# Patient Record
Sex: Female | Born: 1975 | Race: White | Hispanic: No | Marital: Married | Smoking: Never smoker
Health system: Southern US, Community
[De-identification: ages and names within clinical notes are randomized; demographics above are authoritative.]

## PROBLEM LIST (undated history)

## (undated) DIAGNOSIS — E785 Hyperlipidemia, unspecified: Secondary | ICD-10-CM

## (undated) DIAGNOSIS — N200 Calculus of kidney: Secondary | ICD-10-CM

## (undated) DIAGNOSIS — N3090 Cystitis, unspecified without hematuria: Secondary | ICD-10-CM

## (undated) DIAGNOSIS — K219 Gastro-esophageal reflux disease without esophagitis: Secondary | ICD-10-CM

## (undated) DIAGNOSIS — C439 Malignant melanoma of skin, unspecified: Secondary | ICD-10-CM

## (undated) DIAGNOSIS — K5909 Other constipation: Secondary | ICD-10-CM

## (undated) DIAGNOSIS — F411 Generalized anxiety disorder: Secondary | ICD-10-CM

## (undated) DIAGNOSIS — Z8742 Personal history of other diseases of the female genital tract: Secondary | ICD-10-CM

## (undated) DIAGNOSIS — Z8619 Personal history of other infectious and parasitic diseases: Secondary | ICD-10-CM

## (undated) DIAGNOSIS — Z8679 Personal history of other diseases of the circulatory system: Secondary | ICD-10-CM

## (undated) DIAGNOSIS — R002 Palpitations: Secondary | ICD-10-CM

## (undated) HISTORY — DX: Malignant melanoma of skin, unspecified: C43.9

## (undated) HISTORY — DX: Personal history of other diseases of the female genital tract: Z87.42

## (undated) HISTORY — DX: Personal history of other diseases of the circulatory system: Z86.79

## (undated) HISTORY — DX: Cystitis, unspecified without hematuria: N30.90

## (undated) HISTORY — DX: Personal history of other infectious and parasitic diseases: Z86.19

## (undated) HISTORY — DX: Generalized anxiety disorder: F41.1

## (undated) HISTORY — DX: Other constipation: K59.09

## (undated) HISTORY — DX: Gastro-esophageal reflux disease without esophagitis: K21.9

## (undated) HISTORY — DX: Palpitations: R00.2

## (undated) HISTORY — DX: Hyperlipidemia, unspecified: E78.5

## (undated) HISTORY — PX: HYSTEROSCOPY: SHX211

---

## 2006-04-13 ENCOUNTER — Ambulatory Visit: Payer: Self-pay | Admitting: Internal Medicine

## 2006-04-21 ENCOUNTER — Ambulatory Visit: Payer: Self-pay

## 2008-08-31 DIAGNOSIS — C439 Malignant melanoma of skin, unspecified: Secondary | ICD-10-CM

## 2008-08-31 HISTORY — DX: Malignant melanoma of skin, unspecified: C43.9

## 2009-05-18 DIAGNOSIS — E785 Hyperlipidemia, unspecified: Secondary | ICD-10-CM

## 2009-05-18 DIAGNOSIS — Z8679 Personal history of other diseases of the circulatory system: Secondary | ICD-10-CM | POA: Insufficient documentation

## 2009-05-18 DIAGNOSIS — K219 Gastro-esophageal reflux disease without esophagitis: Secondary | ICD-10-CM

## 2009-05-18 DIAGNOSIS — F411 Generalized anxiety disorder: Secondary | ICD-10-CM

## 2009-05-18 HISTORY — DX: Hyperlipidemia, unspecified: E78.5

## 2009-05-18 HISTORY — DX: Generalized anxiety disorder: F41.1

## 2009-05-18 HISTORY — DX: Personal history of other diseases of the circulatory system: Z86.79

## 2009-05-18 HISTORY — DX: Gastro-esophageal reflux disease without esophagitis: K21.9

## 2014-03-19 ENCOUNTER — Other Ambulatory Visit (HOSPITAL_COMMUNITY): Payer: Self-pay | Admitting: Obstetrics and Gynecology

## 2014-03-19 DIAGNOSIS — Z3141 Encounter for fertility testing: Secondary | ICD-10-CM

## 2014-03-22 ENCOUNTER — Ambulatory Visit (HOSPITAL_COMMUNITY)
Admission: RE | Admit: 2014-03-22 | Discharge: 2014-03-22 | Disposition: A | Discharge status: Home / Self Care | Payer: BC Managed Care – PPO | Source: Ambulatory Visit | Attending: Obstetrics and Gynecology | Admitting: Obstetrics and Gynecology

## 2014-03-22 DIAGNOSIS — N979 Female infertility, unspecified: Secondary | ICD-10-CM | POA: Insufficient documentation

## 2014-03-22 DIAGNOSIS — Z3141 Encounter for fertility testing: Secondary | ICD-10-CM

## 2014-03-22 MED ORDER — IOHEXOL 300 MG/ML  SOLN
20.0000 mL | Freq: Once | INTRAMUSCULAR | Status: AC | PRN
  Administered 2014-03-22: 20 mL

## 2014-08-31 HISTORY — PX: DIAGNOSTIC LAPAROSCOPY: SUR761

## 2015-03-15 ENCOUNTER — Encounter (HOSPITAL_BASED_OUTPATIENT_CLINIC_OR_DEPARTMENT_OTHER): Admission: RE | Payer: Self-pay | Source: Ambulatory Visit

## 2015-03-15 ENCOUNTER — Ambulatory Visit (HOSPITAL_BASED_OUTPATIENT_CLINIC_OR_DEPARTMENT_OTHER)
Admission: RE | Admit: 2015-03-15 | Payer: BLUE CROSS/BLUE SHIELD | Source: Ambulatory Visit | Admitting: Obstetrics and Gynecology

## 2015-03-15 SURGERY — HYSTEROSCOPY
Anesthesia: General

## 2015-04-03 ENCOUNTER — Encounter: Payer: Self-pay | Admitting: Gastroenterology

## 2015-05-02 DIAGNOSIS — N200 Calculus of kidney: Secondary | ICD-10-CM

## 2015-05-02 HISTORY — PX: DIAGNOSTIC LAPAROSCOPY: SUR761

## 2015-05-02 HISTORY — DX: Calculus of kidney: N20.0

## 2015-06-03 ENCOUNTER — Ambulatory Visit: Payer: BLUE CROSS/BLUE SHIELD | Admitting: Gastroenterology

## 2015-06-03 ENCOUNTER — Encounter: Payer: Self-pay | Admitting: *Deleted

## 2015-09-24 IMAGING — RF DG HYSTEROGRAM
5 series · 5 of 5 positions shown · non-contrast
Comparison: None.

CLINICAL DATA: Infertility.

EXAM:
HYSTEROSALPINGOGRAM
TECHNIQUE: Hysterosalpingogram was performed by the ordering physician under
fluoroscopy. Fluoroscopic images were submitted for radiologic
interpretation following the procedure. Please see the procedural
report for the amount of contrast and the fluoroscopy time utilized.

[Series 1: run · 1 of 1 slices shown (1 of 5)]
[im 1/1]
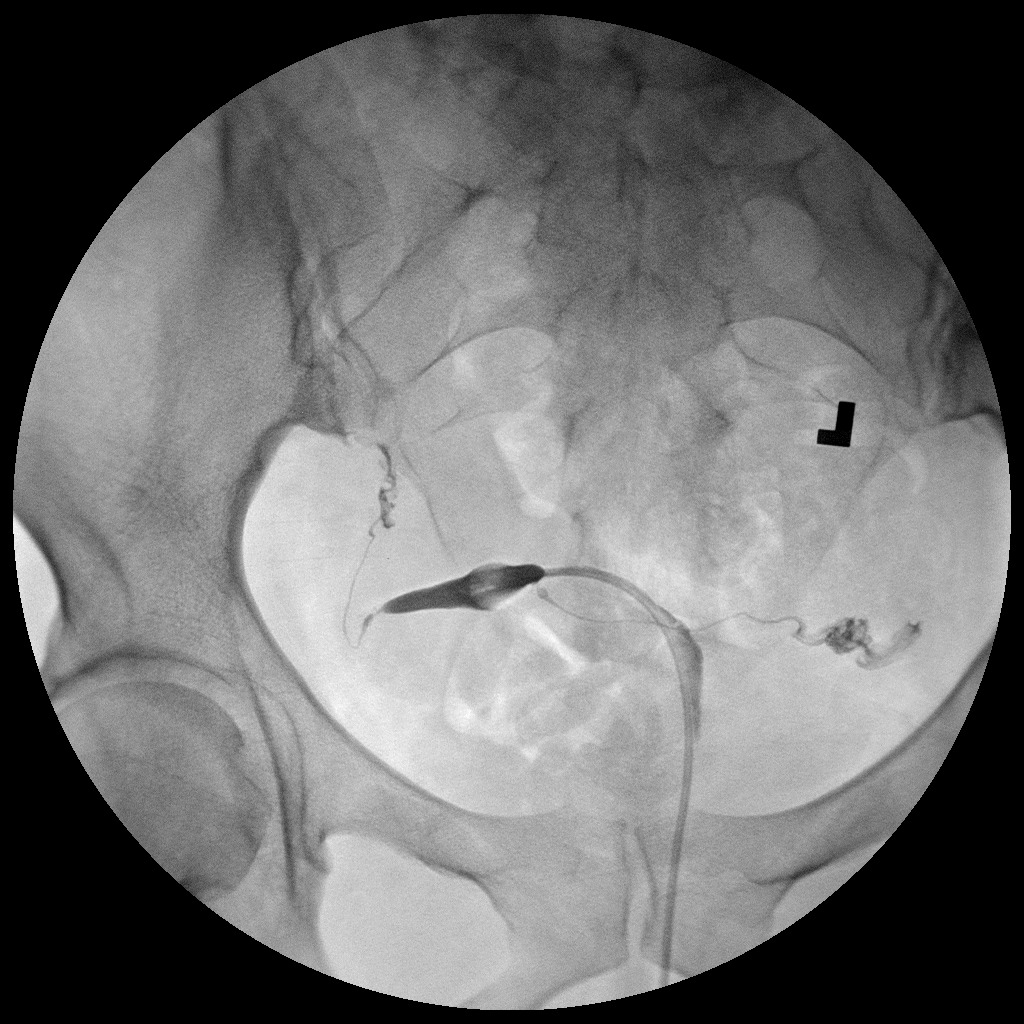

[Series 2: run · 1 of 1 slices shown (2 of 5)]
[im 1/1]
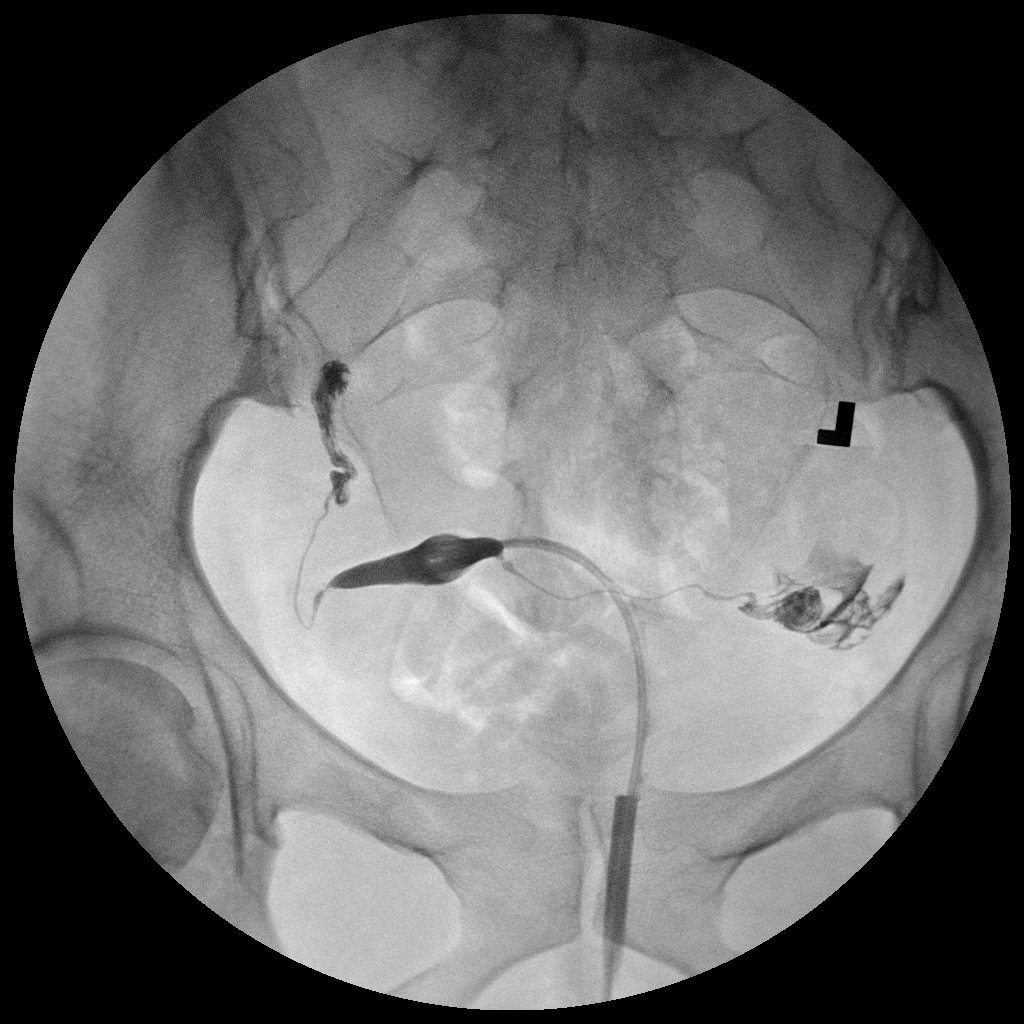

[Series 3: run · 1 of 1 slices shown (3 of 5)]
[im 1/1]
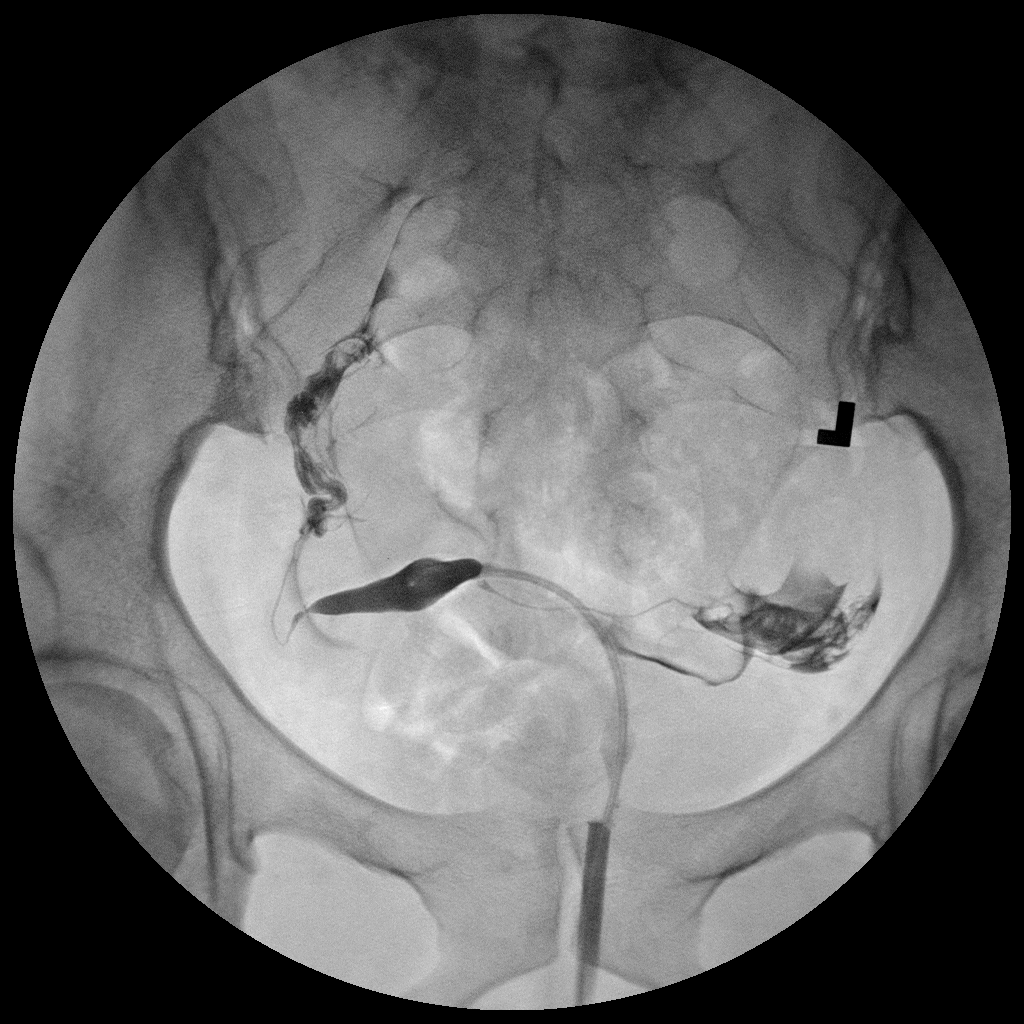

[Series 4: run · 1 of 1 slices shown (4 of 5)]
[im 1/1]
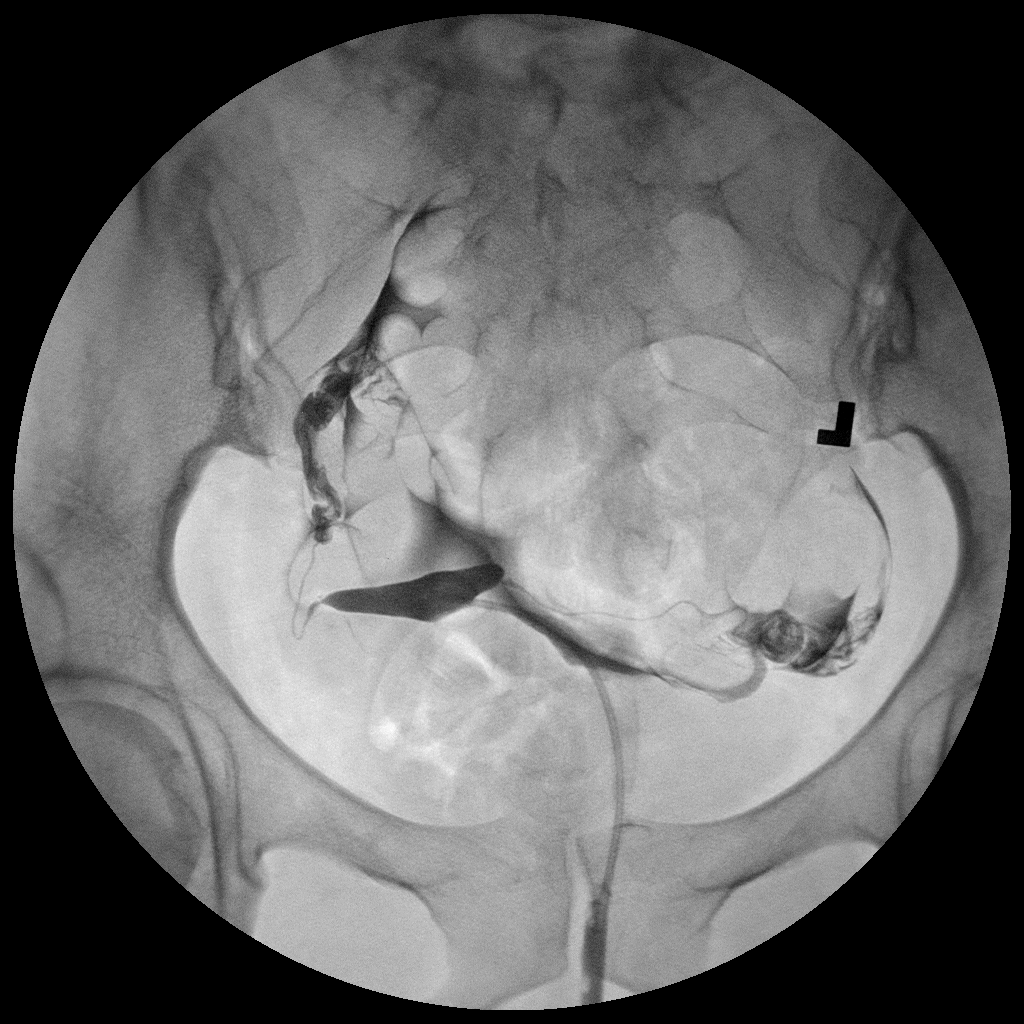

[Series 5: run · 1 of 1 slices shown (5 of 5)]
[im 1/1]
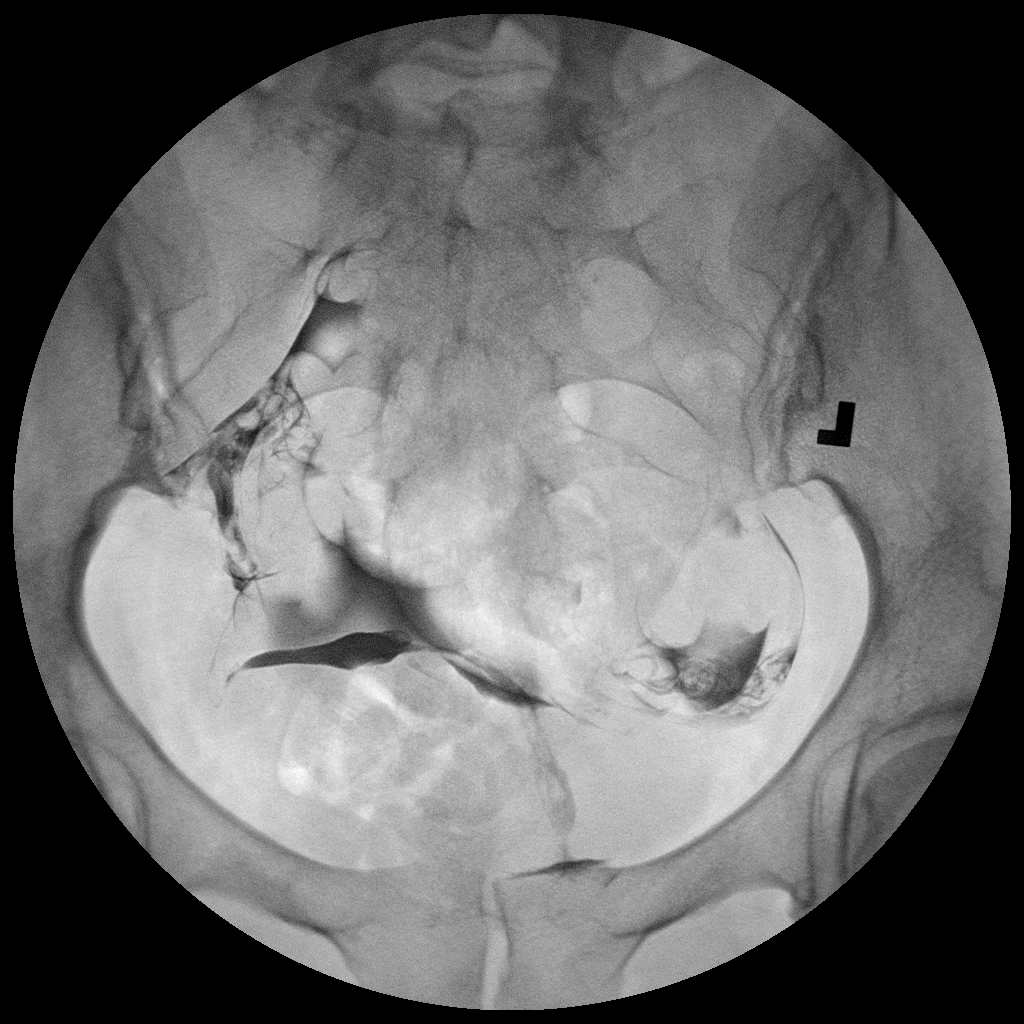

[5 of 5 positions shown; findings below may reference images not displayed]

FINDINGS: The endometrial cavity is normal in appearance and contour. No signs
of Mullerian duct anomaly.

Opacification of both fallopian tubes is seen. Both tubes appear
normal. Intraperitoneal spill of contrast from both fallopian tubes
is demonstrated.
IMPRESSION: Normal study. Both fallopian tubes are patent.

## 2017-02-11 DIAGNOSIS — R002 Palpitations: Secondary | ICD-10-CM | POA: Insufficient documentation

## 2017-02-11 HISTORY — DX: Palpitations: R00.2

## 2017-02-11 NOTE — Progress Notes (Deleted)
Cardiology Office Note    Date:  02/11/2017   ID:  Diamond Hardin, DOB [birth year], MRN [id number]  PCP:  No primary care provider on file.  Cardiologist: Sinclair Grooms, MD   No chief complaint on file.   History of Present Illness:  Diamond Hardin is a 41 y.o. female ***    Past Medical History:  Diagnosis Date  . ANXIETY 05/18/2009   Qualifier: Diagnosis of  By: Danny Lawless CMA, [place]    . GASTROESOPHAGEAL REFLUX DISEASE 05/18/2009   Qualifier: Diagnosis of  By: [place], [place]    . HYPERLIPIDEMIA 05/18/2009   Qualifier: Diagnosis of  By: Danny Lawless CMA, [place]    . Personal history of unspecified circulatory disease 05/18/2009   Centricity Description: PALPITATIONS, HX OF Qualifier: Diagnosis of  By: Danny Lawless CMA, [place]   Centricity Description: CHEST PAIN, HX OF Qualifier: Diagnosis of  By: [place] CMA, [place]      No past surgical history on file.  Current Medications: No outpatient prescriptions prior to visit.   No facility-administered medications prior to visit.      Allergies:   Patient has no allergy information on record.   Social History   Social History  . Marital status: Married    Spouse name: N/A  . Number of children: N/A  . Years of education: N/A   Social History Main Topics  . Smoking status: Not on file  . Smokeless tobacco: Not on file  . Alcohol use Not on file  . Drug use: Unknown  . Sexual activity: Not on file   Other Topics Concern  . Not on file   Social History Narrative  . No narrative on file     Family History:  The patient's ***family history is not on file.   ROS:   Please see the history of present illness.    ***  All other systems reviewed and are negative.   PHYSICAL EXAM:   VS:  There were no vitals taken for this visit.   GEN: Well nourished, well developed, in no acute distress HEENT: normal Neck: no JVD, carotid bruits, or masses Cardiac: ***RRR; no murmurs, rubs, or gallops,no edema  Respiratory:   clear to auscultation bilaterally, normal work of breathing GI: soft, nontender, nondistended, + BS MS: no deformity or atrophy Skin: warm and dry, no rash Neuro:  Alert and Oriented x 3, Strength and sensation are intact Psych: euthymic mood, full affect  Wt Readings from Last 3 Encounters:  No data found for Wt      Studies/Labs Reviewed:   EKG:  EKG  ***  Recent Labs: No results found for requested labs within last 8760 hours.   Lipid Panel No results found for: CHOL, TRIG, HDL, CHOLHDL, VLDL, LDLCALC, LDLDIRECT  Additional studies/ records that were reviewed today include:  ***    ASSESSMENT:    1. Hyperlipidemia LDL goal <100      PLAN:  In order of problems listed above:  1. ***    Medication Adjustments/Labs and Tests Ordered: Current medicines are reviewed at length with the patient today.  Concerns regarding medicines are outlined above.  Medication changes, Labs and Tests ordered today are listed in the Patient Instructions below. There are no Patient Instructions on file for this visit.   Signed, Sinclair Grooms, MD  02/11/2017 4:06 PM    [facility] [place], [place], [place]  [address] Phone: [phone]; Fax: [phone]

## 2017-02-12 ENCOUNTER — Ambulatory Visit: Payer: BLUE CROSS/BLUE SHIELD | Admitting: Interventional Cardiology

## 2017-03-16 ENCOUNTER — Telehealth: Payer: Self-pay | Admitting: *Deleted

## 2017-03-16 ENCOUNTER — Ambulatory Visit (INDEPENDENT_AMBULATORY_CARE_PROVIDER_SITE_OTHER): Payer: 59 | Admitting: Nurse Practitioner

## 2017-03-16 ENCOUNTER — Encounter: Payer: Self-pay | Admitting: Nurse Practitioner

## 2017-03-16 ENCOUNTER — Encounter (INDEPENDENT_AMBULATORY_CARE_PROVIDER_SITE_OTHER): Payer: Self-pay

## 2017-03-16 VITALS — BP 142/70 | HR 94 | Ht 67.0 in | Wt 193.4 lb

## 2017-03-16 DIAGNOSIS — Z3490 Encounter for supervision of normal pregnancy, unspecified, unspecified trimester: Secondary | ICD-10-CM | POA: Diagnosis not present

## 2017-03-16 DIAGNOSIS — R002 Palpitations: Secondary | ICD-10-CM | POA: Diagnosis not present

## 2017-03-16 NOTE — Telephone Encounter (Signed)
S/w [facility], [place].to Jinny Blossom will fax to office pt's last cmet/tsh. # is [phone].

## 2017-03-16 NOTE — Patient Instructions (Addendum)
We will be checking the following labs today - NONE  Please get her most recent TSH and BMET from her OBGYN   Medication Instructions:    Continue with your current medicines.     Testing/Procedures To Be Arranged:  Echocardiogram  Will place an event monitor here if needed but for now - will transmit EKG thru work  Follow-Up:   Will see how your studies turn out and then decide about follow up.      Other Special Instructions:   N/A    If you need a refill on your cardiac medications before your next appointment, please call your pharmacy.   Call the [place] office at [phone] if you have any questions, problems or concerns.

## 2017-03-16 NOTE — Progress Notes (Signed)
CARDIOLOGY OFFICE NOTE  Date:  03/16/2017    Diamond Hardin Date of Birth: September 18, 1975 edical Record #[id number]  PCP:  System, Pcp Not In  Cardiologist:  Lovena Le (NEW/DOD)   Chief Complaint  Patient presents with  . Palpitations    New patient visit - seen for Dr. Lovena Le (DOD)    History of Present Illness: Diamond Hardin is a 41 y.o. female who presents today for a new patient visit. Seen at the request of Dr. Helane Rima - OBGYN for palpitations/heart racing.   No known cardiac history.   Comes in today. Here with her husband. This is her first pregnancy. She is [redacted] weeks pregnant as of tomorrow. 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 Haroldine Laws. Negative evaluation at that time. Felt to be more stress related. 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 Lots of stress with the change to [place] - change in benefits, etc. She notes she has had a total of 4 recent episodes of tachycardia - HR up in the 120's to 130's. Makes her feel anxious. Will last 30 to 45 minutes. Typically happens at work. She did document one spell on a rhythm strip - says it was all sinus tach. I do not have this in the records.  She has no other symptoms in association with this. Does not use caffeine. No chocolate. No alcohol. She admits she has gotten "lazy" and does not exercise. Little swelling in her feet - does use salt and it has been extremely hot. BP borderline. She was placed on aspirin by OBGYN.   Past Medical History:  Diagnosis Date  . ANXIETY 05/18/2009   Qualifier: Diagnosis of  By: Danny Lawless CMA, [place]    . Constipation, chronic   . Cystitis   . GASTROESOPHAGEAL REFLUX DISEASE 05/18/2009   Qualifier: Diagnosis of  By: [place], [place]    . Hx of endometriosis   . Hx of varicella   . HYPERLIPIDEMIA 05/18/2009   Qualifier: Diagnosis of  By: Danny Lawless CMA, [place]    . Hyperlipidemia LDL goal <100 05/18/2009   Qualifier: Diagnosis  of  By: Danny Lawless CMA, [place]    . Melanoma ([place]) 2010  . Palpitations 02/11/2017  . Personal history of unspecified circulatory disease 05/18/2009   Centricity Description: PALPITATIONS, HX OF Qualifier: Diagnosis of  By: Danny Lawless CMA, [place]   Centricity Description: CHEST PAIN, HX OF Qualifier: Diagnosis of  By: [place], [place]      Past Surgical History:  Procedure Laterality Date  . DIAGNOSTIC LAPAROSCOPY N/A 2016   endometreosis  . HYSTEROSCOPY     polyp     Medications: Current Meds  Medication Sig  . aspirin EC 81 MG tablet Take 81 mg by mouth daily.  Marland Kitchen docusate sodium (COLACE) 100 MG capsule Take 100 mg by mouth 2 (two) times daily.  . Prenatal Vit-Fe Fumarate-FA (MULTIVITAMIN-PRENATAL) 27-0.8 MG TABS tablet Take 1 tablet by mouth daily at 12 noon.     Allergies: Allergies  Allergen Reactions  . Sulfa Antibiotics     Social History: The patient  reports that she has never smoked. She has never used smokeless tobacco. She reports that she does not drink alcohol or use drugs.   Family History: The patient's family history includes Breast cancer in her paternal aunt; Diabetes in her maternal grandmother; Diabetes (age of onset: 106) in her mother; Heart disease in her maternal grandmother; Hypertension in  her maternal grandmother; Stroke in her maternal grandmother.   Review of Systems: Please see the history of present illness.   Otherwise, the review of systems is positive for none.   All other systems are reviewed and negative.   Physical Exam: VS:  BP (!) 142/70 (BP Location: Left Arm, Patient Position: Sitting, Cuff Size: Large)   Pulse 94   Ht 5\' 7"  (1.702 m)   Wt 193 lb 6.4 oz (87.7 kg)   BMI 30.29 kg/m  .  BMI Body mass index is 30.29 kg/m.  Wt Readings from Last 3 Encounters:  03/16/17 193 lb 6.4 oz (87.7 kg)    General: Pleasant. Obese female who is alert and in no acute distress.   HEENT: Normal.  Neck: Supple, no JVD, carotid bruits, or masses  noted.  Cardiac: Regular rate and rhythm. No murmurs, rubs, or gallops. No edema.  Respiratory:  Lungs are clear to auscultation bilaterally with normal work of breathing.  GI: Soft and nontender.  MS: No deformity or atrophy. Gait and ROM intact.  Skin: Warm and dry. Color is normal.  Neuro:  Strength and sensation are intact and no gross focal deficits noted.  Psych: Alert, appropriate and with normal affect.   LABORATORY DATA:  EKG:  EKG is ordered today. This demonstrates NSR RSR'. Reviewed with Dr. Lovena Le (DOD)  No results found for: WBC, HGB, HCT, PLT, GLUCOSE, CHOL, TRIG, HDL, LDLDIRECT, LDLCALC, ALT, AST, NA, K, CL, CREATININE, BUN, CO2, TSH, PSA, INR, GLUF, HGBA1C, MICROALBUR   BNP (last 3 results) No results for input(s): BNP in the last 8760 hours.  ProBNP (last 3 results) No results for input(s): PROBNP in the last 8760 hours.   Other Studies Reviewed Today:   Assessment/Plan:  1. Palpitations - tachycardia - negative EKG today other than RSR' but does have LAE noted. She has had recent labs to include TSH - I do not have those results at this time but will request. Discussed with Dr. Lovena Le. Will arrange for echocardiogram to make sure no structural issues. Could place event monitor but she access to EKG at work and will try to transmit that way.  Suspect this will all resolve after delivery. No indication for aspirin from our standpoint.    2. Pregnancy  3. Borderline BP - needs to continue to monitor.   Current medicines are reviewed with the patient today.  The patient does not have concerns regarding medicines other than what has been noted above.  The following changes have been made:  See above.  Labs/ tests ordered today include:    Orders Placed This Encounter  Procedures  . EKG 12-Lead  . ECHOCARDIOGRAM COMPLETE     Disposition:   FU prn unless her studies are abnormal.    Patient is agreeable to this plan and will call if any problems develop  in the interim.   SignedTruitt Merle, NP  03/16/2017 3:29 PM  [facility] [address] [place], [place]  [address] Phone: [phone] Fax: [phone]

## 2017-03-24 ENCOUNTER — Ambulatory Visit (HOSPITAL_COMMUNITY): Payer: 59 | Attending: Internal Medicine

## 2017-03-24 ENCOUNTER — Other Ambulatory Visit: Payer: Self-pay

## 2017-03-24 DIAGNOSIS — Z3A22 22 weeks gestation of pregnancy: Secondary | ICD-10-CM | POA: Insufficient documentation

## 2017-03-24 DIAGNOSIS — Z8249 Family history of ischemic heart disease and other diseases of the circulatory system: Secondary | ICD-10-CM | POA: Diagnosis not present

## 2017-03-24 DIAGNOSIS — R002 Palpitations: Secondary | ICD-10-CM | POA: Diagnosis not present

## 2017-03-24 DIAGNOSIS — O26892 Other specified pregnancy related conditions, second trimester: Secondary | ICD-10-CM | POA: Diagnosis not present

## 2017-05-16 ENCOUNTER — Inpatient Hospital Stay (HOSPITAL_COMMUNITY)
Admission: AD | Admit: 2017-05-16 | Discharge: 2017-05-23 | DRG: 765 | Disposition: A | Discharge status: Home / Self Care | Payer: PRIVATE HEALTH INSURANCE | Source: Ambulatory Visit | Attending: Obstetrics and Gynecology | Admitting: Obstetrics and Gynecology

## 2017-05-16 ENCOUNTER — Encounter (HOSPITAL_COMMUNITY): Payer: Self-pay | Admitting: *Deleted

## 2017-05-16 DIAGNOSIS — Z363 Encounter for antenatal screening for malformations: Secondary | ICD-10-CM

## 2017-05-16 DIAGNOSIS — O9912 Other diseases of the blood and blood-forming organs and certain disorders involving the immune mechanism complicating childbirth: Secondary | ICD-10-CM | POA: Diagnosis present

## 2017-05-16 DIAGNOSIS — O1494 Unspecified pre-eclampsia, complicating childbirth: Secondary | ICD-10-CM | POA: Diagnosis present

## 2017-05-16 DIAGNOSIS — D696 Thrombocytopenia, unspecified: Secondary | ICD-10-CM | POA: Diagnosis present

## 2017-05-16 DIAGNOSIS — Z6791 Unspecified blood type, Rh negative: Secondary | ICD-10-CM | POA: Diagnosis not present

## 2017-05-16 DIAGNOSIS — O26893 Other specified pregnancy related conditions, third trimester: Secondary | ICD-10-CM | POA: Diagnosis present

## 2017-05-16 DIAGNOSIS — O321XX0 Maternal care for breech presentation, not applicable or unspecified: Secondary | ICD-10-CM | POA: Diagnosis present

## 2017-05-16 DIAGNOSIS — O1413 Severe pre-eclampsia, third trimester: Secondary | ICD-10-CM

## 2017-05-16 DIAGNOSIS — Z3A29 29 weeks gestation of pregnancy: Secondary | ICD-10-CM

## 2017-05-16 DIAGNOSIS — O1414 Severe pre-eclampsia complicating childbirth: Principal | ICD-10-CM | POA: Diagnosis present

## 2017-05-16 DIAGNOSIS — O1490 Unspecified pre-eclampsia, unspecified trimester: Secondary | ICD-10-CM | POA: Diagnosis present

## 2017-05-16 HISTORY — DX: Calculus of kidney: N20.0

## 2017-05-16 LAB — CBC
HCT: 35.6 % — ABNORMAL LOW (ref 36.0–46.0)
HEMOGLOBIN: 12.4 g/dL (ref 12.0–15.0)
MCH: 31.3 pg (ref 26.0–34.0)
MCHC: 34.8 g/dL (ref 30.0–36.0)
MCV: 89.9 fL (ref 78.0–100.0)
PLATELETS: 177 10*3/uL (ref 150–400)
RBC: 3.96 MIL/uL (ref 3.87–5.11)
RDW: 12.9 % (ref 11.5–15.5)
WBC: 16.4 10*3/uL — ABNORMAL HIGH (ref 4.0–10.5)

## 2017-05-16 LAB — URINALYSIS, ROUTINE W REFLEX MICROSCOPIC
BILIRUBIN URINE: NEGATIVE
Glucose, UA: NEGATIVE mg/dL
Hgb urine dipstick: NEGATIVE
KETONES UR: NEGATIVE mg/dL
Nitrite: NEGATIVE
PROTEIN: 30 mg/dL — AB
SPECIFIC GRAVITY, URINE: 1.017 (ref 1.005–1.030)
pH: 5 (ref 5.0–8.0)

## 2017-05-16 LAB — TROPONIN I

## 2017-05-16 LAB — PROTEIN / CREATININE RATIO, URINE
CREATININE, URINE: 158 mg/dL
PROTEIN CREATININE RATIO: 0.32 mg/mg{creat} — AB (ref 0.00–0.15)
TOTAL PROTEIN, URINE: 50 mg/dL

## 2017-05-16 LAB — COMPREHENSIVE METABOLIC PANEL
ALBUMIN: 2.7 g/dL — AB (ref 3.5–5.0)
ALK PHOS: 121 U/L (ref 38–126)
ALT: 58 U/L — AB (ref 14–54)
AST: 55 U/L — ABNORMAL HIGH (ref 15–41)
Anion gap: 11 (ref 5–15)
BUN: 10 mg/dL (ref 6–20)
CALCIUM: 8.7 mg/dL — AB (ref 8.9–10.3)
CO2: 20 mmol/L — AB (ref 22–32)
CREATININE: 0.61 mg/dL (ref 0.44–1.00)
Chloride: 107 mmol/L (ref 101–111)
GFR calc Af Amer: >60 mL/min (ref >60)
GFR calc non Af Amer: >60 mL/min (ref >60)
GLUCOSE: 88 mg/dL (ref 65–99)
Potassium: 3.8 mmol/L (ref 3.5–5.1)
SODIUM: 138 mmol/L (ref 135–145)
Total Bilirubin: 0.4 mg/dL (ref 0.3–1.2)
Total Protein: 5.9 g/dL — ABNORMAL LOW (ref 6.5–8.1)

## 2017-05-16 LAB — LIPASE, BLOOD: LIPASE: 35 U/L (ref 11–51)

## 2017-05-16 LAB — AMYLASE: AMYLASE: 39 U/L (ref 28–100)

## 2017-05-16 MED ORDER — LABETALOL HCL 5 MG/ML IV SOLN
20.0000 mg | INTRAVENOUS | Status: AC | PRN
  Administered 2017-05-19: 80 mg via INTRAVENOUS
  Administered 2017-05-19: 20 mg via INTRAVENOUS
  Administered 2017-05-19: 40 mg via INTRAVENOUS
  Filled 2017-05-16: qty 8
  Filled 2017-05-16: qty 4

## 2017-05-16 MED ORDER — DOCUSATE SODIUM 100 MG PO CAPS
100.0000 mg | ORAL_CAPSULE | Freq: Every day | ORAL | Status: DC
  Administered 2017-05-16 – 2017-05-18 (×3): 100 mg via ORAL
  Filled 2017-05-16 (×5): qty 1

## 2017-05-16 MED ORDER — HYDRALAZINE HCL 20 MG/ML IJ SOLN
10.0000 mg | Freq: Once | INTRAMUSCULAR | Status: AC | PRN
  Administered 2017-05-19: 10 mg via INTRAVENOUS
  Filled 2017-05-16: qty 1

## 2017-05-16 MED ORDER — LABETALOL HCL 200 MG PO TABS
200.0000 mg | ORAL_TABLET | Freq: Once | ORAL | Status: AC
  Administered 2017-05-16: 200 mg via ORAL
  Filled 2017-05-16: qty 1

## 2017-05-16 MED ORDER — ACETAMINOPHEN 325 MG PO TABS
650.0000 mg | ORAL_TABLET | ORAL | Status: DC | PRN
  Administered 2017-05-16 – 2017-05-19 (×2): 650 mg via ORAL
  Filled 2017-05-16 (×2): qty 2

## 2017-05-16 MED ORDER — ZOLPIDEM TARTRATE 5 MG PO TABS
5.0000 mg | ORAL_TABLET | Freq: Every evening | ORAL | Status: DC | PRN
  Administered 2017-05-16 – 2017-05-18 (×2): 5 mg via ORAL
  Filled 2017-05-16 (×2): qty 1

## 2017-05-16 MED ORDER — CALCIUM CARBONATE ANTACID 500 MG PO CHEW
2.0000 | CHEWABLE_TABLET | ORAL | Status: DC | PRN
  Administered 2017-05-17 – 2017-05-18 (×2): 400 mg via ORAL
  Filled 2017-05-16 (×2): qty 2

## 2017-05-16 MED ORDER — BETAMETHASONE SOD PHOS & ACET 6 (3-3) MG/ML IJ SUSP
12.0000 mg | Freq: Once | INTRAMUSCULAR | Status: AC
  Administered 2017-05-17: 12 mg via INTRAMUSCULAR
  Filled 2017-05-16: qty 2

## 2017-05-16 MED ORDER — BETAMETHASONE SOD PHOS & ACET 6 (3-3) MG/ML IJ SUSP
12.0000 mg | Freq: Once | INTRAMUSCULAR | Status: AC
  Administered 2017-05-16: 12 mg via INTRAMUSCULAR
  Filled 2017-05-16: qty 2

## 2017-05-16 MED ORDER — LABETALOL HCL 200 MG PO TABS
200.0000 mg | ORAL_TABLET | Freq: Two times a day (BID) | ORAL | Status: DC
  Administered 2017-05-16 – 2017-05-19 (×7): 200 mg via ORAL
  Filled 2017-05-16 (×7): qty 1

## 2017-05-16 MED ORDER — PRENATAL MULTIVITAMIN CH
1.0000 | ORAL_TABLET | Freq: Every day | ORAL | Status: DC
  Administered 2017-05-16 – 2017-05-18 (×3): 1 via ORAL
  Filled 2017-05-16 (×4): qty 1

## 2017-05-16 NOTE — H&P (Signed)
Diamond Hardin is a 41 y.o. female presenting for epigastric pain found to have pre-eclampsia. This pregnancy has been c/b AMA (41 years old) and IVF pregnancy with donor egg (THIS IS CONFIDENTIAL). She is RH neg and s/p Rhogam at 71 wga per patient report. She noted feeling severe epigastric pain - unrelenting and not responsive to OTC reflux medication. She continues to have epigastric pain but it has improved and now from 8/10 to 2/10. She denies current HA, SOB, CP, worsening LE edema or change in vision. She has no ctxn, LOF or VB. +FM.   OB History    Gravida Para Term Preterm AB Living   1             SAB TAB Ectopic Multiple Live Births                 Past Medical History:  Diagnosis Date  . ANXIETY 05/18/2009   Qualifier: Diagnosis of  By: Danny Lawless CMA, [place]    . Constipation, chronic   . Cystitis   . GASTROESOPHAGEAL REFLUX DISEASE 05/18/2009   Qualifier: Diagnosis of  By: [place], [place]    . Hx of endometriosis   . Hx of varicella   . HYPERLIPIDEMIA 05/18/2009   Qualifier: Diagnosis of  By: Danny Lawless CMA, [place]    . Hyperlipidemia LDL goal <100 05/18/2009   Qualifier: Diagnosis of  By: Danny Lawless CMA, [place]    . Melanoma ([place]) 2010  . Nephrolithiasis 05/02/2015  . Palpitations 02/11/2017  . Personal history of unspecified circulatory disease 05/18/2009   Centricity Description: PALPITATIONS, HX OF Qualifier: Diagnosis of  By: Danny Lawless CMA, [place]   Centricity Description: CHEST PAIN, HX OF Qualifier: Diagnosis of  By: [place], [place]     Past Surgical History:  Procedure Laterality Date  . DIAGNOSTIC LAPAROSCOPY N/A 2016   endometreosis  . DIAGNOSTIC LAPAROSCOPY N/A 05/02/2015  . HYSTEROSCOPY     polyp   Family History: family history includes Breast cancer in her paternal aunt; Cancer in her paternal grandfather; Diabetes in her maternal grandmother; Diabetes (age of onset: 36) in her mother; Heart disease in her maternal grandmother; Hypertension in her maternal  grandmother; Stroke in her maternal grandmother. Social History:  reports that she has never smoked. She has never used smokeless tobacco. She reports that she does not drink alcohol or use drugs.     Maternal Diabetes: No Genetic Screening: Normal, panorama RR female ingant Maternal Ultrasounds/Referrals: Normal Fetal Ultrasounds or other Referrals:  None Maternal Substance Abuse:  No Significant Maternal Medications:  None Significant Maternal Lab Results:  None Other Comments:  None  ROS History   Blood pressure (!) 155/77, pulse 98, temperature 98.1 F (36.7 C), temperature source Oral, resp. rate 18, height 5\' 7"  (1.702 m), weight 93.4 kg (206 lb). Exam Physical Exam  Gen: NAD, A&O, well-appearing CV: RRR Pulm: NWOB, CTAB Abd: soft, nondistended, gravid SVE: deferred Ext: trace edema b/l Neuro: grossly intact 2-12, reflexes WNL  Prenatal labs: ABO, Rh: --/--/AB NEG (09/16 0801) Antibody: POS (09/16 0801) Rubella:   RPR:    HBsAg:    HIV:    GBS:     Assessment/Plan: 41 yo G1P0 @ 29.5wga presenting with pre-eclampsia with severe features.    # Pre-eclampsia with severe features: Elevated BP to 160s/90s and transaminitis. Discussed in detail pre-eclampsia. Discussed risk of IUFD, seizures, liver rupture, stroke, etc.  with expectant management but that fetal benefits typically outweigh risk of prematurity until 34gwa with severe  features and 37wga without severe features. Discussed indications for delivery prior to that. Discussed the likelihood that disease will improve in the immediate future s/s "steriod holiday" after BMZ administration but that this does not ensure her PIH is improving.  - Bps 150s-160s/90s on arrival, Labetalol 200mg  BID intitiated, ctm closely, IV anti-hypertensives prn severe range - PIH symptoms: + epigastric pain that has now improved significantly. Otherwise denies any s/s severe dz.  - PIH labs:  UPC 0.32  AST 55, ALT 58  CRT, PLTs, and  Hgb WNL  For daily PIH labs - Anticipate Magnesium initiation when delivery is indicated for seizure proph and also for CP proph given <32 wga - Consider [place] on Monday to assess fetal growth and position. SVE deferred for now given no c/p ctxn, etc.  # MWB:  - h/o LSC for endometriosis and HSC for polyp - AMA - IVF pregnancy (confidential)  # FWB:  - NICU consult - BMZ for FLM, next dose 9/17 @ ~0715 - NST TID, consider spacing out to once a day if fetal status reassuring - Consider growth [place] tomorrow as above  # GBS: - unknown, swabbed in MAU, f/u results - if delivery indicated prior to results, would be for PCN given prematurity and GBS currently unknown  # Dispo: Antepartum, likely for remainder of pregnancy   Tyson Dense 05/16/2017, 9:57 AM

## 2017-05-16 NOTE — MAU Note (Signed)
Pt. Complains of upper abdominal pain radiating to her back and up to her left shoulder blade.  Pain started around 2100 on 05/16/2017.

## 2017-05-16 NOTE — MAU Provider Note (Signed)
Chief Complaint  Patient presents with  . Abdominal Pain     First Provider Initiated Contact with Patient 05/16/17 0502      S: Diamond Hardin  is a 41 y.o. y.o. year old G6P0 female at [redacted]w[redacted]d weeks gestation who presents to MAU with epigastric pain and noted to have elevated blood pressure of 160/85 upon arrival to MAU. pos Hx borderline CHTN, no meds. Current blood pressure medication: none  Associated symptoms: Denies Headache, denies vision changes, reports epigastric pain Aggravating or alleviating factors. No improvement with Maalox. Contractions: Denies Vaginal bleeding: Denies Fetal movement: Normal   O:  Patient Vitals for the past 24 hrs: 0647: 155/77 0634: 161/92  BP Temp Temp src Pulse Resp Height Weight  05/16/17 0539 (!) 146/91 - - 96 - - -  05/16/17 0440 (!) 150/83 - - 95 - - -  05/16/17 0433 (!) 160/85 98.1 F (36.7 C) Oral 92 18 5\' 7"  (1.702 m) 204 lb (92.5 kg)   General: NAD Heart: Regular rate Lungs: Normal rate and effort Abd: Soft, NT, Gravid, S=D Extremities: Trace Pedal edema Neuro: 2+ deep tendon reflexes, No clonus Pelvic: Deferred    EFM: 155, Moderate variability, 15 x 15 accelerations, no decelerations Toco: Rare, mild  Results for orders placed or performed during the hospital encounter of 05/16/17 (from the past 24 hour(s))  Comprehensive metabolic panel     Status: Abnormal   Collection Time: 05/16/17  5:46 AM  Result Value Ref Range   Sodium 138 135 - 145 mmol/L   Potassium 3.8 3.5 - 5.1 mmol/L   Chloride 107 101 - 111 mmol/L   CO2 20 (L) 22 - 32 mmol/L   Glucose, Bld 88 65 - 99 mg/dL   BUN 10 6 - 20 mg/dL   Creatinine, Ser 0.61 0.44 - 1.00 mg/dL   Calcium 8.7 (L) 8.9 - 10.3 mg/dL   Total Protein 5.9 (L) 6.5 - 8.1 g/dL   Albumin 2.7 (L) 3.5 - 5.0 g/dL   AST 55 (H) 15 - 41 U/L   ALT 58 (H) 14 - 54 U/L   Alkaline Phosphatase 121 38 - 126 U/L   Total Bilirubin 0.4 0.3 - 1.2 mg/dL   GFR calc non Af Amer >60 >60 mL/min   GFR calc  Af Amer >60 >60 mL/min   Anion gap 11 5 - 15  CBC     Status: Abnormal   Collection Time: 05/16/17  5:46 AM  Result Value Ref Range   WBC 16.4 (H) 4.0 - 10.5 K/uL   RBC 3.96 3.87 - 5.11 MIL/uL   Hemoglobin 12.4 12.0 - 15.0 g/dL   HCT 35.6 (L) 36.0 - 46.0 %   MCV 89.9 78.0 - 100.0 fL   MCH 31.3 26.0 - 34.0 pg   MCHC 34.8 30.0 - 36.0 g/dL   RDW 12.9 11.5 - 15.5 %   Platelets 177 150 - 400 K/uL    A: [redacted]w[redacted]d week IUP Preeclampsia w/ severe features (BP, epigastric pain, elevated LFTs--although not meeting criteria of twice upper limit of Nml) FHR reactive  P: Admit to High-risk OB unit per consult with Tyson Dense, MD. Start PO Labetalol. IV Labetalol not given per consult w/ MD due to repeat BP </=15 minutes after SRBP no longer severe-range.   Tamala Julian, [place], [place] 05/16/2017 6:20 AM

## 2017-05-16 NOTE — Progress Notes (Signed)
Dr. Royston Sinner informed by phone of patient's monitoring result.  contracting every 2-5 min, lasting 60-120 min; patient denies feeling any.  No new orders made.  Will continue to with care.

## 2017-05-16 NOTE — Consult Note (Signed)
Neonatology Consult Note:  At the request of the patients obstetrician Dr Royston Sinner I met with Diamond Hardin who is 29 5 weeks currently with pregnancy complicated by preeclampsia.  She is being managed with labetalol and has received one dose of betamethasone.   We discussed morbidity/mortality at this gestional age, delivery room resuscitation, including intubation and surfactant in DR.  Discussed mechanical ventilation and risk for chronic lung disease, risk for IVH with potential for motor / cognitive deficits, ROP, NEC, sepsis, as well as temperature instability and feeding immaturity.  Discussed NG / OG feeds, benefits of MBM in reducing incidence of NEC.   Discussed likely length of stay.  Thank you for allowing [place] to participate in her care.  Please call with questions.  Higinio Roger, DO  Neonatologist  The total length of face-to-face or floor / unit time for this encounter was 25 minutes.  Counseling and / or coordination of care was greater than fifty percent of the time.

## 2017-05-17 ENCOUNTER — Inpatient Hospital Stay (HOSPITAL_COMMUNITY): Payer: PRIVATE HEALTH INSURANCE

## 2017-05-17 LAB — COMPREHENSIVE METABOLIC PANEL
ALBUMIN: 2.4 g/dL — AB (ref 3.5–5.0)
ALT: 49 U/L (ref 14–54)
AST: 42 U/L — AB (ref 15–41)
Alkaline Phosphatase: 112 U/L (ref 38–126)
Anion gap: 11 (ref 5–15)
BUN: 15 mg/dL (ref 6–20)
CHLORIDE: 107 mmol/L (ref 101–111)
CO2: 18 mmol/L — AB (ref 22–32)
CREATININE: 0.59 mg/dL (ref 0.44–1.00)
Calcium: 8.4 mg/dL — ABNORMAL LOW (ref 8.9–10.3)
GFR calc Af Amer: >60 mL/min (ref >60)
GFR calc non Af Amer: >60 mL/min (ref >60)
Glucose, Bld: 102 mg/dL — ABNORMAL HIGH (ref 65–99)
POTASSIUM: 3.8 mmol/L (ref 3.5–5.1)
SODIUM: 136 mmol/L (ref 135–145)
Total Bilirubin: 0.3 mg/dL (ref 0.3–1.2)
Total Protein: 5.2 g/dL — ABNORMAL LOW (ref 6.5–8.1)

## 2017-05-17 LAB — CBC
HCT: 32.2 % — ABNORMAL LOW (ref 36.0–46.0)
HEMOGLOBIN: 11.1 g/dL — AB (ref 12.0–15.0)
MCH: 31.6 pg (ref 26.0–34.0)
MCHC: 34.5 g/dL (ref 30.0–36.0)
MCV: 91.7 fL (ref 78.0–100.0)
Platelets: 173 10*3/uL (ref 150–400)
RBC: 3.51 MIL/uL — ABNORMAL LOW (ref 3.87–5.11)
RDW: 13.2 % (ref 11.5–15.5)
WBC: 15.7 10*3/uL — ABNORMAL HIGH (ref 4.0–10.5)

## 2017-05-17 NOTE — Progress Notes (Signed)
29 6/7 weeks No HA, no vision change, no epigastric pain today  Vitals:   05/17/17 0535 05/17/17 0830  BP: 131/71 (!) 144/68  Pulse: 96 94  Resp: 18 16  Temp: 98.2 F (36.8 C) 98 F (36.7 C)  SpO2: 100% 100%   Lungs CTA Cor RRR Abdomen no epigastric tenderness DTR 2+ without clonus  FHT cat one UCs irritability  Results for orders placed or performed during the hospital encounter of 05/16/17 (from the past 24 hour(s))  Comprehensive metabolic panel     Status: Abnormal   Collection Time: 05/17/17  5:26 AM  Result Value Ref Range   Sodium 136 135 - 145 mmol/L   Potassium 3.8 3.5 - 5.1 mmol/L   Chloride 107 101 - 111 mmol/L   CO2 18 (L) 22 - 32 mmol/L   Glucose, Bld 102 (H) 65 - 99 mg/dL   BUN 15 6 - 20 mg/dL   Creatinine, Ser 0.59 0.44 - 1.00 mg/dL   Calcium 8.4 (L) 8.9 - 10.3 mg/dL   Total Protein 5.2 (L) 6.5 - 8.1 g/dL   Albumin 2.4 (L) 3.5 - 5.0 g/dL   AST 42 (H) 15 - 41 U/L   ALT 49 14 - 54 U/L   Alkaline Phosphatase 112 38 - 126 U/L   Total Bilirubin 0.3 0.3 - 1.2 mg/dL   GFR calc non Af Amer >60 >60 mL/min   GFR calc Af Amer >60 >60 mL/min   Anion gap 11 5 - 15  CBC     Status: Abnormal   Collection Time: 05/17/17  5:26 AM  Result Value Ref Range   WBC 15.7 (H) 4.0 - 10.5 K/uL   RBC 3.51 (L) 3.87 - 5.11 MIL/uL   Hemoglobin 11.1 (L) 12.0 - 15.0 g/dL   HCT 32.2 (L) 36.0 - 46.0 %   MCV 91.7 78.0 - 100.0 fL   MCH 31.6 26.0 - 34.0 pg   MCHC 34.5 30.0 - 36.0 g/dL   RDW 13.2 11.5 - 15.5 %   Platelets 173 150 - 400 K/uL   A/P: 29 6/7 weeks         Preeclampsia with severe features         LFT better today after steroids         BMTZ x 2         Labs in am         MFM U/S and consult         D/W patient magnesium sulfate for delivery for seizure prophylaxis and fetal neuroprotection

## 2017-05-17 NOTE — Consult Note (Signed)
MFM Note  Ms. Ortner is a 41 year old Caucasian G1 at 29+6 weeks who was admitted yesterday from the MAU. She presented c/o upper abdominal/epigastric pain which radiated around to her back. She tried reflux medication without any relief of the discomfort. Also, she had noticed lower extremity edema for ~ 2 weeks. She denied any other symptoms such as shortness of breath, headache or change in vision.   Prenatal course: IVF (donor egg) - confidential; low risk NIPS; screen negative MSAFP  Upon admission to the MAU, her BPs were noted to be elevated: 160/85, 150/83, 146/91 and 161/92. Labs results were as follows: H/H 11.1/32.2, plts 173K from 248K (at NOB); Cr. 0.59; AST 55, ALT 58 and PCR 0.32. Amylase, lipase and troponin were normal.  Since admission, she has received a course of BMZ and was started on labetalol 200 mgs q12 hours. Labs were repeated this AM and the LFTs improved to 42 and 49. Her abdominal pain gradually resolved and she had no complaints this morning. BPs have been > 160/90.  Ultrasound today: singleton, single umbilical artery (known), all other anatomy was normal with limited views of CSP, posterior fossa and arches; EFW at the 40th %tile; normal amniotic fluid volume and cephalic presentation.  Assessement: 1) SIUP at 29+6 weeks 2) Preeclampsia with severe features (SBPs > 294) 3) Single umbilical artery 4) Advanced maternal age with no co-morbidities except for obesity 5) IVF with donor egg - confidential! Suggestions:  - labetalol could be increased to 1200 mgs bid or 800 mgs TID to keep BPs ~ 150s-140s/90s-80s; would not add second agent  - Consider 24 hour for total protein and CrCl  - Initiate magnesium sulfate for delivery and continue for at least 24 hours postpartum  - Continue serial labs; ultrasounds for fetal growth (q 2-3 weeks)  - Daily assessment of maternal and fetal status  - Deliver for fetal or maternal deterioration: pulmonary edema, uncontrollable  hypertension, eclampsia, non-reassuring fetal heart tracing, HELLP syndrome or persistent, severe CNS symptoms or epigastric pain  - Deliver by 34 weeks if all goes well until then  Thank you for the kind referral. Please call with any questions or concerns.  (Face-to-face consultation with patient: 45 min)

## 2017-05-18 LAB — COMPREHENSIVE METABOLIC PANEL
ALBUMIN: 2.6 g/dL — AB (ref 3.5–5.0)
ALT: 46 U/L (ref 14–54)
ANION GAP: 12 (ref 5–15)
AST: 38 U/L (ref 15–41)
Alkaline Phosphatase: 102 U/L (ref 38–126)
BUN: 15 mg/dL (ref 6–20)
CALCIUM: 8.9 mg/dL (ref 8.9–10.3)
CHLORIDE: 108 mmol/L (ref 101–111)
CO2: 18 mmol/L — AB (ref 22–32)
Creatinine, Ser: 0.59 mg/dL (ref 0.44–1.00)
GFR calc non Af Amer: >60 mL/min (ref >60)
Glucose, Bld: 90 mg/dL (ref 65–99)
POTASSIUM: 3.8 mmol/L (ref 3.5–5.1)
Sodium: 138 mmol/L (ref 135–145)
Total Bilirubin: 0.5 mg/dL (ref 0.3–1.2)
Total Protein: 5.8 g/dL — ABNORMAL LOW (ref 6.5–8.1)

## 2017-05-18 LAB — CBC
HCT: 32.4 % — ABNORMAL LOW (ref 36.0–46.0)
Hemoglobin: 11 g/dL — ABNORMAL LOW (ref 12.0–15.0)
MCH: 31.4 pg (ref 26.0–34.0)
MCHC: 34 g/dL (ref 30.0–36.0)
MCV: 92.6 fL (ref 78.0–100.0)
Platelets: 170 10*3/uL (ref 150–400)
RBC: 3.5 MIL/uL — ABNORMAL LOW (ref 3.87–5.11)
RDW: 13.3 % (ref 11.5–15.5)
WBC: 16.6 10*3/uL — ABNORMAL HIGH (ref 4.0–10.5)

## 2017-05-18 LAB — CULTURE, BETA STREP (GROUP B ONLY)

## 2017-05-18 MED ORDER — GI COCKTAIL ~~LOC~~
30.0000 mL | Freq: Once | ORAL | Status: AC
  Administered 2017-05-18: 30 mL via ORAL
  Filled 2017-05-18: qty 30

## 2017-05-18 MED ORDER — POLYETHYLENE GLYCOL 3350 17 G PO PACK
17.0000 g | PACK | Freq: Two times a day (BID) | ORAL | Status: DC
  Administered 2017-05-18 – 2017-05-19 (×2): 17 g via ORAL
  Filled 2017-05-18 (×3): qty 1

## 2017-05-18 NOTE — Progress Notes (Signed)
Patient doing well. Had some epigastric pain overnight - now resolved. Reports edema is less from admission. Denies Headache. Reports good fetal movement.  BP (!) 146/76 (BP Location: Right Arm)   Pulse 95   Temp 98.4 F (36.9 C) (Oral)   Resp 16   Ht 5\' 7"  (1.702 m)   Wt 93.4 kg (206 lb)   SpO2 98%   BMI 32.26 kg/m   Abdomen is soft and non tender  Extremities + pitting edema  Results for orders placed or performed during the hospital encounter of 05/16/17 (from the past 24 hour(s))  CBC     Status: Abnormal   Collection Time: 05/18/17  5:37 AM  Result Value Ref Range   WBC 16.6 (H) 4.0 - 10.5 K/uL   RBC 3.50 (L) 3.87 - 5.11 MIL/uL   Hemoglobin 11.0 (L) 12.0 - 15.0 g/dL   HCT 32.4 (L) 36.0 - 46.0 %   MCV 92.6 78.0 - 100.0 fL   MCH 31.4 26.0 - 34.0 pg   MCHC 34.0 30.0 - 36.0 g/dL   RDW 13.3 11.5 - 15.5 %   Platelets 170 150 - 400 K/uL  Comprehensive metabolic panel     Status: Abnormal   Collection Time: 05/18/17  5:37 AM  Result Value Ref Range   Sodium 138 135 - 145 mmol/L   Potassium 3.8 3.5 - 5.1 mmol/L   Chloride 108 101 - 111 mmol/L   CO2 18 (L) 22 - 32 mmol/L   Glucose, Bld 90 65 - 99 mg/dL   BUN 15 6 - 20 mg/dL   Creatinine, Ser 0.59 0.44 - 1.00 mg/dL   Calcium 8.9 8.9 - 10.3 mg/dL   Total Protein 5.8 (L) 6.5 - 8.1 g/dL   Albumin 2.6 (L) 3.5 - 5.0 g/dL   AST 38 15 - 41 U/L   ALT 46 14 - 54 U/L   Alkaline Phosphatase 102 38 - 126 U/L   Total Bilirubin 0.5 0.3 - 1.2 mg/dL   GFR calc non Af Amer >60 >60 mL/min   GFR calc Af Amer >60 >60 mL/min   Anion gap 12 5 - 15   Impression: IUP at 30 weeks Preeclampsia with severe features Singe umbilical artery   PLAN: Patient is currently stable on bedrest and labetalol Appreciate MFM consultation Continue daily labs and monitoring of fetal status Deliver at 34 weeks or earlier if fetal or maternal status changes

## 2017-05-19 ENCOUNTER — Encounter (HOSPITAL_COMMUNITY): Payer: Self-pay | Admitting: *Deleted

## 2017-05-19 ENCOUNTER — Encounter (HOSPITAL_COMMUNITY)
Admission: AD | Disposition: A | Discharge status: Home / Self Care | Payer: Self-pay | Source: Ambulatory Visit | Attending: Obstetrics and Gynecology

## 2017-05-19 ENCOUNTER — Inpatient Hospital Stay (HOSPITAL_COMMUNITY): Payer: PRIVATE HEALTH INSURANCE | Admitting: Anesthesiology

## 2017-05-19 LAB — CBC
HCT: 34.2 % — ABNORMAL LOW (ref 36.0–46.0)
Hemoglobin: 11.7 g/dL — ABNORMAL LOW (ref 12.0–15.0)
MCH: 31.7 pg (ref 26.0–34.0)
MCHC: 34.2 g/dL (ref 30.0–36.0)
MCV: 92.7 fL (ref 78.0–100.0)
PLATELETS: 161 10*3/uL (ref 150–400)
RBC: 3.69 MIL/uL — ABNORMAL LOW (ref 3.87–5.11)
RDW: 13 % (ref 11.5–15.5)
WBC: 15.2 10*3/uL — AB (ref 4.0–10.5)

## 2017-05-19 LAB — TYPE AND SCREEN
ABO/RH(D): AB NEG
Antibody Screen: POSITIVE
DAT, IgG: NEGATIVE
UNIT DIVISION: 0
Unit division: 0

## 2017-05-19 LAB — COMPREHENSIVE METABOLIC PANEL
ALBUMIN: 2.7 g/dL — AB (ref 3.5–5.0)
ALT: 65 U/L — ABNORMAL HIGH (ref 14–54)
AST: 61 U/L — AB (ref 15–41)
Alkaline Phosphatase: 109 U/L (ref 38–126)
Anion gap: 10 (ref 5–15)
BUN: 13 mg/dL (ref 6–20)
CHLORIDE: 108 mmol/L (ref 101–111)
CO2: 21 mmol/L — ABNORMAL LOW (ref 22–32)
Calcium: 8.6 mg/dL — ABNORMAL LOW (ref 8.9–10.3)
Creatinine, Ser: 0.64 mg/dL (ref 0.44–1.00)
GFR calc Af Amer: >60 mL/min (ref >60)
GFR calc non Af Amer: >60 mL/min (ref >60)
GLUCOSE: 90 mg/dL (ref 65–99)
POTASSIUM: 3.7 mmol/L (ref 3.5–5.1)
SODIUM: 139 mmol/L (ref 135–145)
Total Bilirubin: 0.6 mg/dL (ref 0.3–1.2)
Total Protein: 5.8 g/dL — ABNORMAL LOW (ref 6.5–8.1)

## 2017-05-19 LAB — PROTEIN, URINE, 24 HOUR
COLLECTION INTERVAL-UPROT: 24 h
PROTEIN, 24H URINE: 460 mg/d — AB (ref 50–100)
PROTEIN, URINE: 23 mg/dL
Urine Total Volume-UPROT: 2000 mL

## 2017-05-19 LAB — CREATININE CLEARANCE, URINE, 24 HOUR
COLLECTION INTERVAL-CRCL: 24 h
CREATININE, URINE: 91.4 mg/dL
Creatinine Clearance: 215 mL/min — ABNORMAL HIGH (ref 75–115)
Creatinine, 24H Ur: 1828 mg/d — ABNORMAL HIGH (ref 600–1800)
URINE TOTAL VOLUME-CRCL: 2000 mL

## 2017-05-19 LAB — BPAM RBC
BLOOD PRODUCT EXPIRATION DATE: 201810062359
Blood Product Expiration Date: 201810042359
UNIT TYPE AND RH: 600
UNIT TYPE AND RH: 600

## 2017-05-19 LAB — PROTIME-INR
INR: 0.86
Prothrombin Time: 11.6 seconds (ref 11.4–15.2)

## 2017-05-19 SURGERY — Surgical Case
Anesthesia: Spinal

## 2017-05-19 MED ORDER — ZOLPIDEM TARTRATE 5 MG PO TABS: 5.0000 mg | ORAL_TABLET | Freq: Every evening | ORAL | Status: DC | PRN

## 2017-05-19 MED ORDER — SIMETHICONE 80 MG PO CHEW
80.0000 mg | CHEWABLE_TABLET | ORAL | Status: DC
  Administered 2017-05-19 – 2017-05-22 (×4): 80 mg via ORAL
  Filled 2017-05-19 (×4): qty 1

## 2017-05-19 MED ORDER — OXYCODONE-ACETAMINOPHEN 5-325 MG PO TABS: 2.0000 | ORAL_TABLET | ORAL | Status: DC | PRN

## 2017-05-19 MED ORDER — ONDANSETRON HCL 4 MG/2ML IJ SOLN
INTRAMUSCULAR | Status: DC | PRN
  Administered 2017-05-19: 4 mg via INTRAVENOUS

## 2017-05-19 MED ORDER — MAGNESIUM SULFATE 40 G IN LACTATED RINGERS - SIMPLE
2.0000 g/h | INTRAVENOUS | Status: DC
  Administered 2017-05-19: 2 g/h via INTRAVENOUS
  Filled 2017-05-19: qty 500

## 2017-05-19 MED ORDER — WITCH HAZEL-GLYCERIN EX PADS: 1.0000 "application " | MEDICATED_PAD | CUTANEOUS | Status: DC | PRN

## 2017-05-19 MED ORDER — MAGNESIUM SULFATE 4 GM/100ML IV SOLN
4.0000 g | Freq: Once | INTRAVENOUS | Status: DC
  Filled 2017-05-19: qty 100

## 2017-05-19 MED ORDER — LABETALOL HCL 200 MG PO TABS: 200.0000 mg | ORAL_TABLET | Freq: Three times a day (TID) | ORAL | Status: DC

## 2017-05-19 MED ORDER — MORPHINE SULFATE (PF) 0.5 MG/ML IJ SOLN
INTRAMUSCULAR | Status: DC | PRN
  Administered 2017-05-19: .2 mg via INTRATHECAL

## 2017-05-19 MED ORDER — DIPHENHYDRAMINE HCL 25 MG PO CAPS: 25.0000 mg | ORAL_CAPSULE | Freq: Four times a day (QID) | ORAL | Status: DC | PRN

## 2017-05-19 MED ORDER — NALBUPHINE HCL 10 MG/ML IJ SOLN: 5.0000 mg | INTRAMUSCULAR | Status: DC | PRN

## 2017-05-19 MED ORDER — FENTANYL CITRATE (PF) 100 MCG/2ML IJ SOLN
INTRAMUSCULAR | Status: AC
  Filled 2017-05-19: qty 2

## 2017-05-19 MED ORDER — PHENYLEPHRINE 40 MCG/ML (10ML) SYRINGE FOR IV PUSH (FOR BLOOD PRESSURE SUPPORT)
PREFILLED_SYRINGE | INTRAVENOUS | Status: DC | PRN
  Administered 2017-05-19: 80 ug via INTRAVENOUS

## 2017-05-19 MED ORDER — COCONUT OIL OIL
1.0000 "application " | TOPICAL_OIL | Status: DC | PRN
  Administered 2017-05-22: 1 via TOPICAL
  Filled 2017-05-19: qty 120

## 2017-05-19 MED ORDER — PROMETHAZINE HCL 25 MG/ML IJ SOLN: 6.2500 mg | INTRAMUSCULAR | Status: DC | PRN

## 2017-05-19 MED ORDER — DEXTROSE IN LACTATED RINGERS 5 % IV SOLN
INTRAVENOUS | Status: AC
  Administered 2017-05-20 (×2): via INTRAVENOUS

## 2017-05-19 MED ORDER — HYDRALAZINE HCL 20 MG/ML IJ SOLN: 10.0000 mg | Freq: Once | INTRAMUSCULAR | Status: DC | PRN

## 2017-05-19 MED ORDER — IBUPROFEN 800 MG PO TABS
800.0000 mg | ORAL_TABLET | Freq: Three times a day (TID) | ORAL | Status: DC | PRN
  Administered 2017-05-21 – 2017-05-23 (×6): 800 mg via ORAL
  Filled 2017-05-19 (×6): qty 1

## 2017-05-19 MED ORDER — FENTANYL CITRATE (PF) 100 MCG/2ML IJ SOLN
25.0000 ug | INTRAMUSCULAR | Status: DC | PRN
  Administered 2017-05-19: 50 ug via INTRAVENOUS

## 2017-05-19 MED ORDER — MEASLES, MUMPS & RUBELLA VAC ~~LOC~~ INJ: 0.5000 mL | INJECTION | Freq: Once | SUBCUTANEOUS | Status: DC

## 2017-05-19 MED ORDER — ONDANSETRON HCL 4 MG/2ML IJ SOLN: 4.0000 mg | Freq: Three times a day (TID) | INTRAMUSCULAR | Status: DC | PRN

## 2017-05-19 MED ORDER — PHENYLEPHRINE 8 MG IN D5W 100 ML (0.08MG/ML) PREMIX OPTIME
INJECTION | INTRAVENOUS | Status: AC
  Filled 2017-05-19: qty 100

## 2017-05-19 MED ORDER — NALBUPHINE HCL 10 MG/ML IJ SOLN: 5.0000 mg | Freq: Once | INTRAMUSCULAR | Status: DC | PRN

## 2017-05-19 MED ORDER — MEPERIDINE HCL 25 MG/ML IJ SOLN: 6.2500 mg | INTRAMUSCULAR | Status: DC | PRN

## 2017-05-19 MED ORDER — NALOXONE HCL 2 MG/2ML IJ SOSY: 1.0000 ug/kg/h | PREFILLED_SYRINGE | INTRAVENOUS | Status: DC | PRN

## 2017-05-19 MED ORDER — SIMETHICONE 80 MG PO CHEW: 80.0000 mg | CHEWABLE_TABLET | ORAL | Status: DC | PRN

## 2017-05-19 MED ORDER — PANTOPRAZOLE SODIUM 40 MG PO TBEC
40.0000 mg | DELAYED_RELEASE_TABLET | Freq: Every day | ORAL | Status: DC
  Administered 2017-05-19: 40 mg via ORAL
  Filled 2017-05-19: qty 1

## 2017-05-19 MED ORDER — PHENYLEPHRINE 40 MCG/ML (10ML) SYRINGE FOR IV PUSH (FOR BLOOD PRESSURE SUPPORT)
PREFILLED_SYRINGE | INTRAVENOUS | Status: AC
  Filled 2017-05-19: qty 10

## 2017-05-19 MED ORDER — ACETAMINOPHEN 325 MG PO TABS: 650.0000 mg | ORAL_TABLET | ORAL | Status: DC | PRN

## 2017-05-19 MED ORDER — PRENATAL MULTIVITAMIN CH
1.0000 | ORAL_TABLET | Freq: Every day | ORAL | Status: DC
  Administered 2017-05-20 – 2017-05-23 (×4): 1 via ORAL
  Filled 2017-05-19 (×4): qty 1

## 2017-05-19 MED ORDER — NALOXONE HCL 0.4 MG/ML IJ SOLN: 0.4000 mg | INTRAMUSCULAR | Status: DC | PRN

## 2017-05-19 MED ORDER — SODIUM CHLORIDE 0.9 % IR SOLN
Status: DC | PRN
  Administered 2017-05-19: 1

## 2017-05-19 MED ORDER — BUPIVACAINE IN DEXTROSE 0.75-8.25 % IT SOLN
INTRATHECAL | Status: DC | PRN
  Administered 2017-05-19: 1.6 mg via INTRATHECAL

## 2017-05-19 MED ORDER — TETANUS-DIPHTH-ACELL PERTUSSIS 5-2.5-18.5 LF-MCG/0.5 IM SUSP: 0.5000 mL | Freq: Once | INTRAMUSCULAR | Status: DC

## 2017-05-19 MED ORDER — SCOPOLAMINE 1 MG/3DAYS TD PT72
MEDICATED_PATCH | TRANSDERMAL | Status: DC | PRN
  Administered 2017-05-19: 1 via TRANSDERMAL

## 2017-05-19 MED ORDER — DIPHENHYDRAMINE HCL 25 MG PO CAPS: 25.0000 mg | ORAL_CAPSULE | ORAL | Status: DC | PRN

## 2017-05-19 MED ORDER — SODIUM CHLORIDE 0.9 % IV SOLN: 250.0000 mL | INTRAVENOUS | Status: DC

## 2017-05-19 MED ORDER — OXYTOCIN 10 UNIT/ML IJ SOLN
INTRAMUSCULAR | Status: AC
  Filled 2017-05-19: qty 4

## 2017-05-19 MED ORDER — KETOROLAC TROMETHAMINE 30 MG/ML IJ SOLN: 30.0000 mg | Freq: Four times a day (QID) | INTRAMUSCULAR | Status: AC | PRN

## 2017-05-19 MED ORDER — SENNOSIDES-DOCUSATE SODIUM 8.6-50 MG PO TABS
2.0000 | ORAL_TABLET | ORAL | Status: DC
  Administered 2017-05-19 – 2017-05-22 (×4): 2 via ORAL
  Filled 2017-05-19 (×4): qty 2

## 2017-05-19 MED ORDER — OXYCODONE-ACETAMINOPHEN 5-325 MG PO TABS
1.0000 | ORAL_TABLET | ORAL | Status: DC | PRN
  Administered 2017-05-20 – 2017-05-22 (×2): 1 via ORAL
  Filled 2017-05-19 (×2): qty 1

## 2017-05-19 MED ORDER — LACTATED RINGERS IV SOLN
INTRAVENOUS | Status: DC | PRN
  Administered 2017-05-19 (×2): via INTRAVENOUS

## 2017-05-19 MED ORDER — DIBUCAINE 1 % RE OINT: 1.0000 "application " | TOPICAL_OINTMENT | RECTAL | Status: DC | PRN

## 2017-05-19 MED ORDER — BUPIVACAINE IN DEXTROSE 0.75-8.25 % IT SOLN
INTRATHECAL | Status: AC
  Filled 2017-05-19: qty 2

## 2017-05-19 MED ORDER — KETOROLAC TROMETHAMINE 30 MG/ML IJ SOLN
30.0000 mg | Freq: Four times a day (QID) | INTRAMUSCULAR | Status: AC | PRN
  Administered 2017-05-19: 30 mg via INTRAVENOUS
  Filled 2017-05-19: qty 1

## 2017-05-19 MED ORDER — ONDANSETRON HCL 4 MG/2ML IJ SOLN
INTRAMUSCULAR | Status: AC
  Filled 2017-05-19: qty 2

## 2017-05-19 MED ORDER — DIPHENHYDRAMINE HCL 50 MG/ML IJ SOLN
12.5000 mg | INTRAMUSCULAR | Status: DC | PRN
  Administered 2017-05-19: 12.5 mg via INTRAVENOUS
  Filled 2017-05-19: qty 1

## 2017-05-19 MED ORDER — DEXTROSE 5 % IV SOLN: 1.0000 g | Freq: Once | INTRAVENOUS | Status: DC

## 2017-05-19 MED ORDER — FENTANYL CITRATE (PF) 100 MCG/2ML IJ SOLN
INTRAMUSCULAR | Status: DC | PRN
  Administered 2017-05-19: 10 ug via INTRATHECAL

## 2017-05-19 MED ORDER — ACETAMINOPHEN 500 MG PO TABS
1000.0000 mg | ORAL_TABLET | Freq: Four times a day (QID) | ORAL | Status: AC
  Administered 2017-05-19 – 2017-05-20 (×4): 1000 mg via ORAL
  Filled 2017-05-19 (×4): qty 2

## 2017-05-19 MED ORDER — MAGNESIUM SULFATE 40 G IN LACTATED RINGERS - SIMPLE
2.0000 g/h | INTRAVENOUS | Status: DC
  Filled 2017-05-19: qty 500

## 2017-05-19 MED ORDER — SCOPOLAMINE 1 MG/3DAYS TD PT72
1.0000 | MEDICATED_PATCH | Freq: Once | TRANSDERMAL | Status: AC
  Administered 2017-05-19: 1.5 mg via TRANSDERMAL

## 2017-05-19 MED ORDER — DEXTROSE 5 % IV SOLN
2.0000 g | INTRAVENOUS | Status: AC
  Administered 2017-05-19: 2 g via INTRAVENOUS
  Filled 2017-05-19: qty 2

## 2017-05-19 MED ORDER — MENTHOL 3 MG MT LOZG: 1.0000 | LOZENGE | OROMUCOSAL | Status: DC | PRN

## 2017-05-19 MED ORDER — SIMETHICONE 80 MG PO CHEW
80.0000 mg | CHEWABLE_TABLET | Freq: Three times a day (TID) | ORAL | Status: DC
  Administered 2017-05-20 – 2017-05-23 (×6): 80 mg via ORAL
  Filled 2017-05-19 (×7): qty 1

## 2017-05-19 MED ORDER — MORPHINE SULFATE (PF) 0.5 MG/ML IJ SOLN
INTRAMUSCULAR | Status: AC
  Filled 2017-05-19: qty 10

## 2017-05-19 MED ORDER — BISACODYL 10 MG RE SUPP
10.0000 mg | Freq: Every day | RECTAL | Status: DC | PRN
  Administered 2017-05-21: 10 mg via RECTAL
  Filled 2017-05-19: qty 1

## 2017-05-19 MED ORDER — SOD CITRATE-CITRIC ACID 500-334 MG/5ML PO SOLN
ORAL | Status: AC
  Administered 2017-05-19: 30 mL
  Filled 2017-05-19: qty 15

## 2017-05-19 MED ORDER — SODIUM CHLORIDE 0.9% FLUSH: 3.0000 mL | INTRAVENOUS | Status: DC | PRN

## 2017-05-19 MED ORDER — PHENYLEPHRINE 8 MG IN D5W 100 ML (0.08MG/ML) PREMIX OPTIME
INJECTION | INTRAVENOUS | Status: DC | PRN
  Administered 2017-05-19: 60 ug/min via INTRAVENOUS

## 2017-05-19 MED ORDER — SCOPOLAMINE 1 MG/3DAYS TD PT72
MEDICATED_PATCH | TRANSDERMAL | Status: AC
  Filled 2017-05-19: qty 1

## 2017-05-19 MED ORDER — OXYTOCIN 40 UNITS IN LACTATED RINGERS INFUSION - SIMPLE MED
2.5000 [IU]/h | INTRAVENOUS | Status: AC
  Administered 2017-05-19: 2.5 [IU]/h via INTRAVENOUS

## 2017-05-19 MED ORDER — MAGNESIUM SULFATE BOLUS VIA INFUSION
4.0000 g | Freq: Once | INTRAVENOUS | Status: AC
  Administered 2017-05-19: 4 g via INTRAVENOUS
  Filled 2017-05-19: qty 500

## 2017-05-19 MED ORDER — SODIUM CHLORIDE 0.9% FLUSH
3.0000 mL | Freq: Two times a day (BID) | INTRAVENOUS | Status: DC
  Administered 2017-05-20 – 2017-05-22 (×4): 3 mL via INTRAVENOUS

## 2017-05-19 MED ORDER — OXYTOCIN 10 UNIT/ML IJ SOLN
INTRAVENOUS | Status: DC | PRN
  Administered 2017-05-19: 40 [IU] via INTRAVENOUS

## 2017-05-19 MED ORDER — FLEET ENEMA 7-19 GM/118ML RE ENEM: 1.0000 | ENEMA | Freq: Every day | RECTAL | Status: DC | PRN

## 2017-05-19 MED ORDER — LABETALOL HCL 5 MG/ML IV SOLN
20.0000 mg | INTRAVENOUS | Status: DC | PRN
  Filled 2017-05-19: qty 16

## 2017-05-19 SURGICAL SUPPLY — 30 items
CHLORAPREP W/TINT 26ML (MISCELLANEOUS) ×3 IMPLANT
CLAMP CORD UMBIL (MISCELLANEOUS) IMPLANT
CLOSURE WOUND 1/2 X4 (GAUZE/BANDAGES/DRESSINGS) ×1
CLOTH BEACON ORANGE TIMEOUT ST (SAFETY) ×3 IMPLANT
DRSG OPSITE POSTOP 4X10 (GAUZE/BANDAGES/DRESSINGS) ×3 IMPLANT
ELECT REM PT RETURN 9FT ADLT (ELECTROSURGICAL) ×3
ELECTRODE REM PT RTRN 9FT ADLT (ELECTROSURGICAL) ×1 IMPLANT
EXTRACTOR VACUUM M CUP 4 TUBE (SUCTIONS) IMPLANT
EXTRACTOR VACUUM M CUP 4' TUBE (SUCTIONS)
GLOVE BIO SURGEON STRL SZ7 (GLOVE) ×3 IMPLANT
GLOVE BIOGEL PI IND STRL 7.0 (GLOVE) ×2 IMPLANT
GLOVE BIOGEL PI INDICATOR 7.0 (GLOVE) ×4
GOWN STRL REUS W/TWL LRG LVL3 (GOWN DISPOSABLE) ×6 IMPLANT
KIT ABG SYR 3ML LUER SLIP (SYRINGE) IMPLANT
NEEDLE HYPO 25X5/8 SAFETYGLIDE (NEEDLE) ×3 IMPLANT
NS IRRIG 1000ML POUR BTL (IV SOLUTION) ×3 IMPLANT
PACK C SECTION WH (CUSTOM PROCEDURE TRAY) ×3 IMPLANT
PAD OB MATERNITY 4.3X12.25 (PERSONAL CARE ITEMS) ×3 IMPLANT
PENCIL SMOKE EVAC W/HOLSTER (ELECTROSURGICAL) ×3 IMPLANT
STRIP CLOSURE SKIN 1/2X4 (GAUZE/BANDAGES/DRESSINGS) ×2 IMPLANT
SUT CHROMIC 0 CTX 36 (SUTURE) ×12 IMPLANT
SUT MON AB 4-0 PS1 27 (SUTURE) ×3 IMPLANT
SUT PDS AB 0 CT1 27 (SUTURE) ×6 IMPLANT
SUT VIC AB 0 CT1 27 (SUTURE) ×4
SUT VIC AB 0 CT1 27XBRD ANTBC (SUTURE) ×2 IMPLANT
SUT VIC AB 3-0 CT1 27 (SUTURE) ×6
SUT VIC AB 3-0 CT1 TAPERPNT 27 (SUTURE) ×2 IMPLANT
SUT VIC AB 4-0 KS 27 (SUTURE) ×3 IMPLANT
TOWEL OR 17X24 6PK STRL BLUE (TOWEL DISPOSABLE) ×3 IMPLANT
TRAY FOLEY BAG SILVER LF 14FR (SET/KITS/TRAYS/PACK) ×3 IMPLANT

## 2017-05-19 NOTE — Transfer of Care (Signed)
Immediate Anesthesia Transfer of Care Note  Patient: Diamond Hardin  Procedure(s) Performed: Procedure(s): CESAREAN SECTION (N/A)  Patient Location: PACU  Anesthesia Type:Spinal  Level of Consciousness: awake  Airway & Oxygen Therapy: Patient Spontanous Breathing  Post-op Assessment: Report given to RN  Post vital signs: Reviewed and stable  Last Vitals:  Vitals:   05/19/17 1722 05/19/17 1732  BP:  (!) 158/81  Pulse:  (!) 103  Resp:  18  Temp:    SpO2: 100%     Last Pain:  Vitals:   05/19/17 1213  TempSrc:   PainSc: 8          Complications: No apparent anesthesia complications

## 2017-05-19 NOTE — Anesthesia Postprocedure Evaluation (Signed)
Anesthesia Post Note  Patient: Diamond Hardin  Procedure(s) Performed: Procedure(s) (LRB): CESAREAN SECTION (N/A)     Patient location during evaluation: PACU Anesthesia Type: Spinal Level of consciousness: oriented and awake and alert Pain management: pain level controlled Vital Signs Assessment: post-procedure vital signs reviewed and stable Respiratory status: spontaneous breathing, respiratory function stable and patient connected to nasal cannula oxygen Cardiovascular status: blood pressure returned to baseline and stable Postop Assessment: no headache, no backache, no apparent nausea or vomiting, spinal receding and patient able to bend at knees Anesthetic complications: no    Last Vitals:  Vitals:   05/19/17 2015 05/19/17 2030  BP: 134/75   Pulse: 89   Resp: 18   Temp:  (!) 36.4 C  SpO2: 100%     Last Pain:  Vitals:   05/19/17 2015  TempSrc:   PainSc: 4    Pain Goal:                 Catalina Gravel

## 2017-05-19 NOTE — Progress Notes (Signed)
GI cocktail help relieve GERD symptoms

## 2017-05-19 NOTE — Anesthesia Preprocedure Evaluation (Signed)
Anesthesia Evaluation  Patient identified by MRN, date of birth, ID band Patient awake    Reviewed: Allergy & Precautions, NPO status , Patient's Chart, lab work & pertinent test results  Airway Mallampati: II  TM Distance: >3 FB Neck ROM: Full    Dental no notable dental hx.    Pulmonary neg pulmonary ROS,    Pulmonary exam normal breath sounds clear to auscultation       Cardiovascular negative cardio ROS Normal cardiovascular exam Rhythm:Regular Rate:Normal     Neuro/Psych Anxiety negative neurological ROS     GI/Hepatic Neg liver ROS, GERD  ,  Endo/Other  negative endocrine ROS  Renal/GU negative Renal ROS     Musculoskeletal negative musculoskeletal ROS (+)   Abdominal (+) + obese,   Peds  Hematology  (+) anemia ,   Anesthesia Other Findings HYPERLIPIDEMIA  Reproductive/Obstetrics (+) Pregnancy Pre-E                             Anesthesia Physical Anesthesia Plan  ASA: III  Anesthesia Plan: Spinal   Post-op Pain Management:    Induction: Intravenous  PONV Risk Score and Plan: 2 and Ondansetron, Dexamethasone and Treatment may vary due to age or medical condition  Airway Management Planned:   Additional Equipment:   Intra-op Plan:   Post-operative Plan:   Informed Consent: I have reviewed the patients History and Physical, chart, labs and discussed the procedure including the risks, benefits and alternatives for the proposed anesthesia with the patient or authorized representative who has indicated his/her understanding and acceptance.   Dental advisory given  Plan Discussed with: CRNA  Anesthesia Plan Comments:         Anesthesia Quick Evaluation

## 2017-05-19 NOTE — Progress Notes (Signed)
Patient complaints of pressure in a band around underneath her ribs, stating she's "miserable" even after having gotten a dose of protonix po approximately an hour ago. CBC and CMP just drawn.   Discomfort started after eating breakfast per patient.  "Pain" traveling to shoulder blades, patient encouraged to move about in the room.

## 2017-05-19 NOTE — Progress Notes (Signed)
RN called re cont RUQ pain NOT relieved by Ppcod/GI cocktail, AND her am CMET showed rising LFTS>>>started MgSO4 and rec CS delivery since prospect for SVD remote>>proced + risks reviewed

## 2017-05-19 NOTE — Anesthesia Procedure Notes (Signed)
Spinal  Patient location during procedure: OR Start time: 05/19/2017 6:24 PM End time: 05/19/2017 6:27 PM Staffing Anesthesiologist: Catalina Gravel Performed: anesthesiologist  Preanesthetic Checklist Completed: patient identified, surgical consent, pre-op evaluation, timeout performed, IV checked, risks and benefits discussed and monitors and equipment checked Spinal Block Patient position: sitting Prep: site prepped and draped and DuraPrep Patient monitoring: continuous pulse ox and blood pressure Approach: midline Location: L3-4 Injection technique: single-shot Needle Needle type: Pencan  Needle gauge: 24 G Additional Notes Functioning IV was confirmed and monitors were applied. Sterile prep and drape, including hand hygiene, mask and sterile gloves were used. The patient was positioned and the spine was prepped. The skin was anesthetized with lidocaine.  Free flow of clear CSF was obtained prior to injecting local anesthetic into the CSF.  The spinal needle aspirated freely following injection.  The needle was carefully withdrawn.  The patient tolerated the procedure well. Consent was obtained prior to procedure with all questions answered and concerns addressed. Risks including but not limited to bleeding, infection, nerve damage, paralysis, failed block, inadequate analgesia, allergic reaction, high spinal, itching and headache were discussed and the patient wished to proceed.   Hoy Morn, MD

## 2017-05-19 NOTE — Op Note (Signed)
Preoperative diagnosis: 30+ week IUP, preeclampsia with severe features  Postoperative diagnosis: Same plus breech presentation  Procedure: Primary low transverse cesarean section  Surgeon: Matthew Saras  Anesthesia: Spinal  EBL: 700 cc  Procedure and findings  Patient taken the operating room after an adequate level of spinal anesthetic was obtained with the patient in left tilt position the abdomen prepped and draped Foley catheter position appropriate timeouts taken at that point. Transverse incision made 2 finger breaths above the symphysis carried down the fascia which was incised and extended transversely. Rectus muscles divided in the midline, peritoneum entered superiorly without incident and extended in a vertical manner. The bladder flap was developed below, bladder blade was repositioned transverse incision made in the lower segment extended with bandage scissors clear fluid noted the patient then delivered of a vigorous infant from the frank breech presentation, the infant was suctioned cord clamped and passed the pediatric team for further care. Cord blood samples were obtained the placenta was then delivered manually intact, sent to pathology. Uterus exteriorized, cavity wiped clean with laparotomy pack closure obtained with the first layer of 0 chromic in a locked fashion followed by an imbricating layer of 0 chromic. This was hemostatic the bladder flap area was intact and hemostatic. Bilateral tubes and ovaries were normal. Prior to closure sponge, needle, history counts reported as correct 2 peritoneum closed with 3-0 Vicryl suture the same used to reapproximate the rectus muscles in the midline 0 PDS transversely for the fascia. Subcutaneous tissue was hemostatic was not closed separately 4-0 Vicryl subcuticular skin closure with honeycomb dressing. She will be maintained on labetalol and magnesium sulfate postoperatively  Dictated with dragon Maverick M.D.

## 2017-05-19 NOTE — Progress Notes (Addendum)
  Patient ID: ANJALI MANZELLA, female   DOB: 12/02/1975, 41 y.o.   MRN: [id number]   [redacted]w[redacted]d  S/  C/o reflux, no HA  O/  BP (!) 143/74 (BP Location: Right Arm)   Pulse 84   Temp 98.3 F (36.8 C) (Oral)   Resp 18   Ht 5\' 7"  (1.702 m)   Wt 206 lb (93.4 kg)   SpO2 99%   BMI 32.26 kg/m   FHR stable, fundus soft  LFT's improved  A+P//[redacted]w[redacted]d PIH, w/ severe features Cont Labetalo and daily labs VTX Plan to del @ 34 24 urine pending  24 urine>> Crcl= 215 23 mg protein/24 hrs

## 2017-05-20 ENCOUNTER — Encounter (HOSPITAL_COMMUNITY): Payer: Self-pay | Admitting: *Deleted

## 2017-05-20 LAB — CBC
HEMATOCRIT: 33.4 % — AB (ref 36.0–46.0)
HEMOGLOBIN: 11.3 g/dL — AB (ref 12.0–15.0)
MCH: 31.3 pg (ref 26.0–34.0)
MCHC: 33.8 g/dL (ref 30.0–36.0)
MCV: 92.5 fL (ref 78.0–100.0)
Platelets: 117 10*3/uL — ABNORMAL LOW (ref 150–400)
RBC: 3.61 MIL/uL — AB (ref 3.87–5.11)
RDW: 13.2 % (ref 11.5–15.5)
WBC: 15.5 10*3/uL — ABNORMAL HIGH (ref 4.0–10.5)

## 2017-05-20 LAB — COMPREHENSIVE METABOLIC PANEL
ALK PHOS: 98 U/L (ref 38–126)
ALT: 150 U/L — AB (ref 14–54)
AST: 123 U/L — ABNORMAL HIGH (ref 15–41)
Albumin: 2.3 g/dL — ABNORMAL LOW (ref 3.5–5.0)
Anion gap: 8 (ref 5–15)
BILIRUBIN TOTAL: 0.6 mg/dL (ref 0.3–1.2)
BUN: 14 mg/dL (ref 6–20)
CALCIUM: 7.3 mg/dL — AB (ref 8.9–10.3)
CO2: 23 mmol/L (ref 22–32)
CREATININE: 0.59 mg/dL (ref 0.44–1.00)
Chloride: 104 mmol/L (ref 101–111)
GFR calc non Af Amer: >60 mL/min (ref >60)
Glucose, Bld: 91 mg/dL (ref 65–99)
Potassium: 4.4 mmol/L (ref 3.5–5.1)
SODIUM: 135 mmol/L (ref 135–145)
Total Protein: 5.1 g/dL — ABNORMAL LOW (ref 6.5–8.1)

## 2017-05-20 MED ORDER — MAGNESIUM SULFATE 40 G IN LACTATED RINGERS - SIMPLE
2.0000 g/h | INTRAVENOUS | Status: AC
  Filled 2017-05-20: qty 500

## 2017-05-20 MED ORDER — MAGNESIUM SULFATE 40 G IN LACTATED RINGERS - SIMPLE
2.0000 g/h | INTRAVENOUS | Status: DC
  Administered 2017-05-20 (×2): 2 g/h via INTRAVENOUS
  Filled 2017-05-20: qty 40

## 2017-05-20 NOTE — Progress Notes (Signed)
Foley d/c'd per nursing protocol @1850 . No LDA found to document on.

## 2017-05-20 NOTE — Progress Notes (Signed)
Subjective: Postpartum Day 1: Cesarean Delivery No complaints. Feeling well. Lochia appropriate. No subjective fevers/chills. Denies SOB, Cp, worsening swelling, RUQ pain, epigastric pain, HA and blurry vision.     Objective: Vital signs in last 24 hours: Temp:  [97.5 F (36.4 C)-98.7 F (37.1 C)] 97.8 F (36.6 C) (09/20 0700) Pulse Rate:  [76-109] 76 (09/20 0700) Resp:  [12-21] 18 (09/20 0700) BP: (127-182)/(64-92) 136/78 (09/20 0700) SpO2:  [98 %-100 %] 100 % (09/20 0700)  Physical Exam:  General: alert, cooperative and appears stated age 17: appropriate Uterine Fundus: firm Incision: healing well, no significant drainage, no dehiscence, no significant erythema DVT Evaluation: No evidence of DVT seen on physical exam. Negative Homan's sign. No cords or calf tenderness.   Recent Labs  05/19/17 1022 05/20/17 0528  HGB 11.7* 11.3*  HCT 34.2* 33.4*   CBC    Component Value Date/Time   WBC 15.5 (H) 05/20/2017 0528   RBC 3.61 (L) 05/20/2017 0528   HGB 11.3 (L) 05/20/2017 0528   HCT 33.4 (L) 05/20/2017 0528   PLT 117 (L) 05/20/2017 0528   MCV 92.5 05/20/2017 0528   MCH 31.3 05/20/2017 0528   MCHC 33.8 05/20/2017 0528   RDW 13.2 05/20/2017 0528    CMP Latest Ref Rng & Units 05/20/2017 05/19/2017 05/18/2017  Glucose 65 - 99 mg/dL 91 90 90  BUN 6 - 20 mg/dL 14 13 15   Creatinine 0.44 - 1.00 mg/dL 0.59 0.64 0.59  Sodium 135 - 145 mmol/L 135 139 138  Potassium 3.5 - 5.1 mmol/L 4.4 3.7 3.8  Chloride 101 - 111 mmol/L 104 108 108  CO2 22 - 32 mmol/L 23 21(L) 18(L)  Calcium 8.9 - 10.3 mg/dL 7.3(L) 8.6(L) 8.9  Total Protein 6.5 - 8.1 g/dL 5.1(L) 5.8(L) 5.8(L)  Total Bilirubin 0.3 - 1.2 mg/dL 0.6 0.6 0.5  Alkaline Phos 38 - 126 U/L 98 109 102  AST 15 - 41 U/L 123(H) 61(H) 38  ALT 14 - 54 U/L 150(H) 65(H) 46     Assessment/Plan: A/P:  POD#1 s/p pCS s/s pre-eclampsia with severe features and remote from delivery, doing well pp. AFVSS. Benign exam. However, has  persistent transaminitis (asymtpomatic) and mild thrombocytopenia. Possibly developing HELLP syndrome.   # Pre-eclampsia with severe features: Magnesium per protocol x 24 hours pp PIH labs: PLTs mildly low, not <100k. However, LFTs continue to uptrend, now in 100s. Suspect may be developing HELLP syndrome. However, clinically appears very well and Bps normal. Will repeat labs tomorrow.   Plts 117   AST 123  ALT 150  CRT 0.59  Hgb 11.3 IV anti-hypertensives prn persistent severe range Bps CTM Bps - no meds needed currently  # Postpartum: Continue to advance diet as tol, ambulation, etc.   # Dispo: Inpatient  Lucillie Garfinkel MD   Colin Benton Esdras Delair 05/20/2017, 10:52 AM

## 2017-05-20 NOTE — Lactation Note (Signed)
This note was copied from a baby's chart. Lactation Consultation Note  Patient Name: Diamond Hardin [id number] Date: 05/20/2017 Reason for consult: Initial assessment;NICU baby  Baby 56 hours old. Mom reports that she did not feel like pumping until just now. Assisted mom with collection of a few drops of EBM after mom finished pumping. Reviewed EBM storage guidelines and how to label and transport to NICU. Enc mom to pump every 2-3 hours for a total of 8-12 times/24 hours followed by hand expression. Discussed progression of milk coming to volume and supply and demand. Enc mom to offer STS and nuzzling/latching as she and baby able. Enc parents to call private insurance about pump, and mom aware of pump rentals and pumping rooms in NICU. Mom given NICU booklet and supplies, and aware of how to assemble, disassemble and wash pumping parts. Enc parents to take pumping kit at D/C.   Maternal Data Has patient been taught Hand Expression?: Yes Does the patient have breastfeeding experience prior to this delivery?: No  Feeding    LATCH Score                   Interventions    Lactation Tools Discussed/Used Tools: Pump Breast pump type: Double-Electric Breast Pump WIC Program: No Pump Review: Setup, frequency, and cleaning;Milk Storage Initiated by:: Arbie Cookey, RN Date initiated:: 05/20/17   Consult Status Consult Status: Follow-up Date: 05/21/17 Follow-up type: In-patient    Andres Labrum 05/20/2017, 2:46 PM

## 2017-05-21 LAB — CBC
HCT: 29.2 % — ABNORMAL LOW (ref 36.0–46.0)
HEMOGLOBIN: 9.9 g/dL — AB (ref 12.0–15.0)
MCH: 31.7 pg (ref 26.0–34.0)
MCHC: 33.9 g/dL (ref 30.0–36.0)
MCV: 93.6 fL (ref 78.0–100.0)
PLATELETS: 106 10*3/uL — AB (ref 150–400)
RBC: 3.12 MIL/uL — AB (ref 3.87–5.11)
RDW: 13.6 % (ref 11.5–15.5)
WBC: 14.9 10*3/uL — AB (ref 4.0–10.5)

## 2017-05-21 LAB — COMPREHENSIVE METABOLIC PANEL
ALK PHOS: 84 U/L (ref 38–126)
ALT: 91 U/L — AB (ref 14–54)
ANION GAP: 7 (ref 5–15)
AST: 48 U/L — ABNORMAL HIGH (ref 15–41)
Albumin: 2.3 g/dL — ABNORMAL LOW (ref 3.5–5.0)
BILIRUBIN TOTAL: 0.4 mg/dL (ref 0.3–1.2)
BUN: 11 mg/dL (ref 6–20)
CALCIUM: 7.7 mg/dL — AB (ref 8.9–10.3)
CO2: 23 mmol/L (ref 22–32)
CREATININE: 0.65 mg/dL (ref 0.44–1.00)
Chloride: 110 mmol/L (ref 101–111)
Glucose, Bld: 82 mg/dL (ref 65–99)
Potassium: 3.9 mmol/L (ref 3.5–5.1)
Sodium: 140 mmol/L (ref 135–145)
TOTAL PROTEIN: 5.2 g/dL — AB (ref 6.5–8.1)

## 2017-05-21 NOTE — Progress Notes (Signed)
CSW attempted to meet with MOB to offer support and complete assessment, but she was not in her room at this time.  CSW will attempt again at a later time.

## 2017-05-21 NOTE — Progress Notes (Signed)
Subjective: Postpartum Day 2: Cesarean Delivery Patient reports tolerating PO and no problems voiding.    Objective: Vital signs in last 24 hours: Temp:  [97.9 F (36.6 C)-98.5 F (36.9 C)] 98.5 F (36.9 C) (09/21 0826) Pulse Rate:  [74-102] 102 (09/21 0826) Resp:  [16-18] 18 (09/21 0826) BP: (132-157)/(69-97) 146/97 (09/21 0826) SpO2:  [98 %-100 %] 100 % (09/21 0826)  Physical Exam:  General: alert and cooperative Lochia: appropriate Uterine Fundus: firm Incision: no significant drainage DVT Evaluation: No evidence of DVT seen on physical exam.  Results for orders placed or performed during the hospital encounter of 05/16/17 (from the past 24 hour(s))  Comprehensive metabolic panel     Status: Abnormal   Collection Time: 05/20/17  9:31 AM  Result Value Ref Range   Sodium 135 135 - 145 mmol/L   Potassium 4.4 3.5 - 5.1 mmol/L   Chloride 104 101 - 111 mmol/L   CO2 23 22 - 32 mmol/L   Glucose, Bld 91 65 - 99 mg/dL   BUN 14 6 - 20 mg/dL   Creatinine, Ser 0.59 0.44 - 1.00 mg/dL   Calcium 7.3 (L) 8.9 - 10.3 mg/dL   Total Protein 5.1 (L) 6.5 - 8.1 g/dL   Albumin 2.3 (L) 3.5 - 5.0 g/dL   AST 123 (H) 15 - 41 U/L   ALT 150 (H) 14 - 54 U/L   Alkaline Phosphatase 98 38 - 126 U/L   Total Bilirubin 0.6 0.3 - 1.2 mg/dL   GFR calc non Af Amer >60 >60 mL/min   GFR calc Af Amer >60 >60 mL/min   Anion gap 8 5 - 15  Comprehensive metabolic panel     Status: Abnormal   Collection Time: 05/21/17  5:53 AM  Result Value Ref Range   Sodium 140 135 - 145 mmol/L   Potassium 3.9 3.5 - 5.1 mmol/L   Chloride 110 101 - 111 mmol/L   CO2 23 22 - 32 mmol/L   Glucose, Bld 82 65 - 99 mg/dL   BUN 11 6 - 20 mg/dL   Creatinine, Ser 0.65 0.44 - 1.00 mg/dL   Calcium 7.7 (L) 8.9 - 10.3 mg/dL   Total Protein 5.2 (L) 6.5 - 8.1 g/dL   Albumin 2.3 (L) 3.5 - 5.0 g/dL   AST 48 (H) 15 - 41 U/L   ALT 91 (H) 14 - 54 U/L   Alkaline Phosphatase 84 38 - 126 U/L   Total Bilirubin 0.4 0.3 - 1.2 mg/dL   GFR calc  non Af Amer >60 >60 mL/min   GFR calc Af Amer >60 >60 mL/min   Anion gap 7 5 - 15  CBC     Status: Abnormal   Collection Time: 05/21/17  5:53 AM  Result Value Ref Range   WBC 14.9 (H) 4.0 - 10.5 K/uL   RBC 3.12 (L) 3.87 - 5.11 MIL/uL   Hemoglobin 9.9 (L) 12.0 - 15.0 g/dL   HCT 29.2 (L) 36.0 - 46.0 %   MCV 93.6 78.0 - 100.0 fL   MCH 31.7 26.0 - 34.0 pg   MCHC 33.9 30.0 - 36.0 g/dL   RDW 13.6 11.5 - 15.5 %   Platelets 106 (L) 150 - 400 K/uL   Assessment/Plan: Status post Cesarean section. Doing well postoperatively.  Continue current care s/p Mag. BP stable, LFTs improved thrombocytopenia  Diamond Hardin 05/21/2017, 8:53 AM

## 2017-05-21 NOTE — Lactation Note (Signed)
This note was copied from a baby's chart. Lactation Consultation Note  Patient Name: Diamond Hardin ELYHT'M Date: 05/21/2017 Reason for consult: Follow-up assessment;NICU baby;Preterm <34wks;Infant < 6lbs   Follow up with mom of 74 hour old NICU infant. Mom is pumping every 3 hours and getting gtts colostrum, enc mom to hand express post pumping and she was able to obtain some more gtts. Mom has been able to hold infant and infant is feeding donor milk. Mom asked about breast milk storage and it was reviewed.   Dad is speaking with insurance company about breast pump and was given phone # for [employer] to inquire about pump rental. Parents without further questions/concerns.    Maternal Data Formula Feeding for Exclusion: No Has patient been taught Hand Expression?: Yes Does the patient have breastfeeding experience prior to this delivery?: No  Feeding Feeding Type: Donor Breast Milk Length of feed: 30 min  LATCH Score                   Interventions    Lactation Tools Discussed/Used Pump Review: Setup, frequency, and cleaning;Milk Storage Initiated by:: Reviewed and encouraged   Consult Status Consult Status: Follow-up Date: 05/22/17 Follow-up type: In-patient    Diamond Hardin 05/21/2017, 12:54 PM

## 2017-05-22 MED ORDER — INFLUENZA VAC SPLIT QUAD 0.5 ML IM SUSY
0.5000 mL | PREFILLED_SYRINGE | INTRAMUSCULAR | Status: AC
  Administered 2017-05-23: 0.5 mL via INTRAMUSCULAR
  Filled 2017-05-22: qty 0.5

## 2017-05-22 NOTE — Lactation Note (Signed)
This note was copied from a baby's chart. Lactation Consultation Note  Patient Name: Girl Pecolia Marando [id number] Date: 05/22/2017 Reason for consult: Follow-up assessment;NICU baby;Preterm <34wks;Infant < 6lbs Infant is 97 hours old & seen by [facility] for follow-up assessment. Mom was in her room when [facility] entered. Mom reports she has been pumping q 2.5-3hrs on Initiate phase followed by hand expression. Mom reports she pumped ~41mL from both breasts this last time and that she did not get any milk last night.  Discussed milk volume and encouraged mom to continue pumping at least 8x in 24hrs followed by hand expression. Mom reports her nipples were starting to get sore & asked what lotion she could use. Encouraged mom to use her own breastmilk and that she could use coconut oil as well ([place] asked her RN to bring mom some Coconut oil). Mom also asked about a larger size flange because they are rubbing so LC brought mom a set of size 87mm flanges. Mom pumped with LC in room and the 57mm did appear to fit better than the 25mm mom had been using and mom reported more comfort pumping with the size 56mm flanges.  Mom reports she plans to go to [employer] today to see about renting a pump until they can get one from their insurance. Mom reports no questions at this time. Encouraged mom to ask for help as needed.  Maternal Data    Feeding    LATCH Score                   Interventions Interventions: DEBP  Lactation Tools Discussed/Used     Consult Status Consult Status: Follow-up Date: 05/23/17 Follow-up type: In-patient    Yvonna Alanis 05/22/2017, 1:34 PM

## 2017-05-22 NOTE — Progress Notes (Signed)
Subjective: Postpartum Day 3: Cesarean Delivery Patient reports tolerating PO, + flatus and no problems voiding.    Objective: Vital signs in last 24 hours: Temp:  [98.1 F (36.7 C)-99.2 F (37.3 C)] 98.3 F (36.8 C) (09/22 0800) Pulse Rate:  [76-93] 89 (09/22 0800) Resp:  [16-18] 18 (09/22 0800) BP: (139-157)/(70-89) 157/81 (09/22 0800) SpO2:  [98 %-99 %] 98 % (09/22 0800)  Physical Exam:  General: alert and appears stated age 41: appropriate Uterine Fundus: firm Incision: healing well, no significant drainage DVT Evaluation: No evidence of DVT seen on physical exam.   Recent Labs  05/20/17 0528 05/21/17 0553  HGB 11.3* 9.9*  HCT 33.4* 29.2*    Assessment/Plan: Status post Cesarean section. Doing well postoperatively.  Continue current care.  Lawonda Pretlow 05/22/2017, 10:08 AM

## 2017-05-23 LAB — TYPE AND SCREEN
ABO/RH(D): AB NEG
Antibody Screen: POSITIVE
DAT, IgG: NEGATIVE
UNIT DIVISION: 0
Unit division: 0

## 2017-05-23 LAB — CBC
HEMATOCRIT: 29.9 % — AB (ref 36.0–46.0)
HEMOGLOBIN: 10.2 g/dL — AB (ref 12.0–15.0)
MCH: 31.5 pg (ref 26.0–34.0)
MCHC: 34.1 g/dL (ref 30.0–36.0)
MCV: 92.3 fL (ref 78.0–100.0)
Platelets: 159 10*3/uL (ref 150–400)
RBC: 3.24 MIL/uL — AB (ref 3.87–5.11)
RDW: 13.3 % (ref 11.5–15.5)
WBC: 12.4 10*3/uL — AB (ref 4.0–10.5)

## 2017-05-23 LAB — BPAM RBC
BLOOD PRODUCT EXPIRATION DATE: 201810062359
Blood Product Expiration Date: 201810042359
UNIT TYPE AND RH: 600
Unit Type and Rh: 600

## 2017-05-23 LAB — COMPREHENSIVE METABOLIC PANEL
ALBUMIN: 2.5 g/dL — AB (ref 3.5–5.0)
ALT: 82 U/L — ABNORMAL HIGH (ref 14–54)
AST: 57 U/L — AB (ref 15–41)
Alkaline Phosphatase: 81 U/L (ref 38–126)
Anion gap: 7 (ref 5–15)
BILIRUBIN TOTAL: 0.5 mg/dL (ref 0.3–1.2)
BUN: 11 mg/dL (ref 6–20)
CO2: 23 mmol/L (ref 22–32)
Calcium: 8.8 mg/dL — ABNORMAL LOW (ref 8.9–10.3)
Chloride: 108 mmol/L (ref 101–111)
Creatinine, Ser: 0.6 mg/dL (ref 0.44–1.00)
GFR calc Af Amer: >60 mL/min (ref >60)
GFR calc non Af Amer: >60 mL/min (ref >60)
GLUCOSE: 86 mg/dL (ref 65–99)
POTASSIUM: 4.2 mmol/L (ref 3.5–5.1)
Sodium: 138 mmol/L (ref 135–145)
TOTAL PROTEIN: 5.7 g/dL — AB (ref 6.5–8.1)

## 2017-05-23 MED ORDER — OXYCODONE-ACETAMINOPHEN 5-325 MG PO TABS: 1.0000 | ORAL_TABLET | ORAL | 0 refills | Status: AC | PRN

## 2017-05-23 MED ORDER — IBUPROFEN 800 MG PO TABS: 800.0000 mg | ORAL_TABLET | Freq: Three times a day (TID) | ORAL | 0 refills | Status: AC | PRN

## 2017-05-23 NOTE — Discharge Summary (Signed)
Obstetric Discharge Summary Reason for Admission: pre-eclampsia Prenatal Procedures: NST, Preeclampsia and ultrasound Intrapartum Procedures: cesarean: low cervical, transverse Postpartum Procedures: none Complications-Operative and Postpartum: none Hemoglobin  Date Value Ref Range Status  05/23/2017 10.2 (L) 12.0 - 15.0 g/dL Final   HCT  Date Value Ref Range Status  05/23/2017 29.9 (L) 36.0 - 46.0 % Final    Physical Exam:  General: alert and cooperative Lochia: appropriate Uterine Fundus: firm Incision: no significant drainage DVT Evaluation: No evidence of DVT seen on physical exam.  Discharge Diagnoses: Preelampsia  Discharge Information: Date: 05/23/2017 Activity: pelvic rest Diet: routine Medications: PNV, Ibuprofen and Percocet Condition: stable Instructions: refer to practice specific booklet Discharge to: home   Newborn Data: Live born female  Birth Weight: 3 lb 0.7 oz (1380 g) APGAR: 5, 8  Home with NICU.  Jeffrey Voth 05/23/2017, 7:22 AM

## 2017-05-23 NOTE — Lactation Note (Signed)
This note was copied from a baby's chart. Lactation Consultation Note  Patient Name: Diamond Hardin YJEHU'D Date: 05/23/2017 Reason for consult: Follow-up assessment;NICU baby;Infant < 6lbs;Preterm <34wks   Follow up with parents of 55 hour old infant. Infant is eating well in the NICU via NG. Mom reports her milk volume is increasing and she was able to express 3 colostrum collection containers this morning (20-30 ml). Mom has a Symphony pump to take home and is planning to purchase a personal pump. Colostrum collections containers and larger collection bottles given. Discussed transporting milk in cooler with blue ice packs when visiting NICU. Parents live about an hour away from the hospital in [place]. Parents without further questions/concerns at this time.    Maternal Data Formula Feeding for Exclusion: No Has patient been taught Hand Expression?: Yes Does the patient have breastfeeding experience prior to this delivery?: No  Feeding Feeding Type: Donor Breast Milk Length of feed: 45 min  LATCH Score                   Interventions    Lactation Tools Discussed/Used Pump Review: Setup, frequency, and cleaning;Milk Storage Initiated by:: Reviewed and encouraged   Consult Status Consult Status: PRN Follow-up type: Call as needed    Donn Pierini 05/23/2017, 8:00 AM

## 2017-05-23 NOTE — Plan of Care (Signed)
Problem: Role Relationship: Goal: Ability to demonstrate positive interaction with newborn will improve Outcome: Progressing Parents visiting infant in NICU.  Problem: Skin Integrity: Goal: Demonstration of wound healing without infection will improve Outcome: Progressing Honeycomb dressing in place. Pt instructed in care of wound and signs to contact MD about.

## 2017-05-24 ENCOUNTER — Inpatient Hospital Stay (HOSPITAL_COMMUNITY)
Admission: AD | Admit: 2017-05-24 | Discharge: 2017-05-24 | Disposition: A | Discharge status: Home / Self Care | Payer: PRIVATE HEALTH INSURANCE | Source: Ambulatory Visit | Attending: Obstetrics and Gynecology | Admitting: Obstetrics and Gynecology

## 2017-05-24 ENCOUNTER — Encounter (HOSPITAL_COMMUNITY): Payer: Self-pay | Admitting: *Deleted

## 2017-05-24 DIAGNOSIS — O1495 Unspecified pre-eclampsia, complicating the puerperium: Secondary | ICD-10-CM | POA: Insufficient documentation

## 2017-05-24 DIAGNOSIS — R030 Elevated blood-pressure reading, without diagnosis of hypertension: Secondary | ICD-10-CM | POA: Diagnosis present

## 2017-05-24 LAB — RH IG WORKUP (INCLUDES ABO/RH)
ABO/RH(D): AB NEG
FETAL SCREEN: NEGATIVE
Gestational Age(Wks): 30.1
UNIT DIVISION: 0

## 2017-05-24 LAB — CBC
HCT: 31.6 % — ABNORMAL LOW (ref 36.0–46.0)
Hemoglobin: 10.7 g/dL — ABNORMAL LOW (ref 12.0–15.0)
MCH: 31.2 pg (ref 26.0–34.0)
MCHC: 33.9 g/dL (ref 30.0–36.0)
MCV: 92.1 fL (ref 78.0–100.0)
PLATELETS: 237 10*3/uL (ref 150–400)
RBC: 3.43 MIL/uL — AB (ref 3.87–5.11)
RDW: 13.1 % (ref 11.5–15.5)
WBC: 11.8 10*3/uL — AB (ref 4.0–10.5)

## 2017-05-24 LAB — URINALYSIS, ROUTINE W REFLEX MICROSCOPIC
BACTERIA UA: NONE SEEN
BILIRUBIN URINE: NEGATIVE
Glucose, UA: NEGATIVE mg/dL
Ketones, ur: NEGATIVE mg/dL
LEUKOCYTES UA: NEGATIVE
NITRITE: NEGATIVE
PROTEIN: NEGATIVE mg/dL
Specific Gravity, Urine: 1.005 (ref 1.005–1.030)
pH: 6 (ref 5.0–8.0)

## 2017-05-24 LAB — PROTEIN / CREATININE RATIO, URINE
CREATININE, URINE: 34 mg/dL
Protein Creatinine Ratio: 0.24 mg/mg{Cre} — ABNORMAL HIGH (ref 0.00–0.15)
Total Protein, Urine: 8 mg/dL

## 2017-05-24 LAB — COMPREHENSIVE METABOLIC PANEL
ALT: 70 U/L — ABNORMAL HIGH (ref 14–54)
AST: 39 U/L (ref 15–41)
Albumin: 2.9 g/dL — ABNORMAL LOW (ref 3.5–5.0)
Alkaline Phosphatase: 85 U/L (ref 38–126)
Anion gap: 10 (ref 5–15)
BUN: 10 mg/dL (ref 6–20)
CHLORIDE: 108 mmol/L (ref 101–111)
CO2: 22 mmol/L (ref 22–32)
Calcium: 8.6 mg/dL — ABNORMAL LOW (ref 8.9–10.3)
Creatinine, Ser: 0.61 mg/dL (ref 0.44–1.00)
Glucose, Bld: 83 mg/dL (ref 65–99)
POTASSIUM: 3.8 mmol/L (ref 3.5–5.1)
Sodium: 140 mmol/L (ref 135–145)
Total Bilirubin: 0.8 mg/dL (ref 0.3–1.2)
Total Protein: 6.5 g/dL (ref 6.5–8.1)

## 2017-05-24 NOTE — MAU Note (Signed)
Delivered on 9/19. C-section for preeclampsia. Discharge home yesterday. Had appointment today and b/p was 152/102. Felt a little dizzy and not feeling well. No headache.

## 2017-05-24 NOTE — MAU Provider Note (Signed)
Chief Complaint:  Hypertension   First Provider Initiated Contact with Patient 05/24/17 1242      HPI: Diamond Hardin is a 41 y.o. G1P0101 who presents to maternity admissions reporting elevated blood pressure in office today, sent here for further workup.  Denies headache or visual changes.  Some crampy upper abdominal pain at times.  Not persistent. She reports normal postpartum vaginal bleeding, vaginal itching/burning, urinary symptoms, h/a, dizziness, n/v, or fever/chills.    Hypertension  This is a recurrent problem. The current episode started today. The problem is unchanged. Associated symptoms include peripheral edema (improved since delivery). Pertinent negatives include no anxiety, blurred vision, chest pain or headaches. There are no associated agents to hypertension. There are no known risk factors for coronary artery disease. There are no compliance problems.     RN Note: Delivered on 9/19. C-section for preeclampsia. Discharge home yesterday. Had appointment today and b/p was 152/102. Felt a little dizzy and not feeling well. No headache. Pt sent to MAU from MD office for BP evaluation.  Pt s/p Cesarean Section 05/19/17 for Pre-Eclampsia.  Pt denies H/A & visual disturbances.  Does c/o RUQ pain, describes as crampy.  DTR's 2+, neg clonus.  Past Medical History: Past Medical History:  Diagnosis Date  . ANXIETY 05/18/2009   Qualifier: Diagnosis of  By: Danny Lawless CMA, [place]    . Constipation, chronic   . Cystitis   . GASTROESOPHAGEAL REFLUX DISEASE 05/18/2009   Qualifier: Diagnosis of  By: [place], [place]    . Hx of endometriosis   . Hx of varicella   . HYPERLIPIDEMIA 05/18/2009   Qualifier: Diagnosis of  By: Danny Lawless CMA, [place]    . Hyperlipidemia LDL goal <100 05/18/2009   Qualifier: Diagnosis of  By: Danny Lawless CMA, [place]    . Melanoma ([place]) 2010  . Nephrolithiasis 05/02/2015  . Palpitations 02/11/2017  . Personal history of unspecified circulatory disease 05/18/2009   Centricity Description: PALPITATIONS, HX OF Qualifier: Diagnosis of  By: Danny Lawless CMA, [place]   Centricity Description: CHEST PAIN, HX OF Qualifier: Diagnosis of  By: Danny Lawless CMA, [place]      Past obstetric history: OB History  Gravida Para Term Preterm AB Living  1 1   1   1   SAB TAB Ectopic Multiple Live Births        0 1    # Outcome Date GA Lbr Len/2nd Weight Sex Delivery Anes PTL Lv  1 Preterm 05/19/17 [redacted]w[redacted]d  3 lb 0.7 oz (1.38 kg) F CS-LTranv Spinal  LIV      Past Surgical History: Past Surgical History:  Procedure Laterality Date  . CESAREAN SECTION N/A 05/19/2017   Procedure: CESAREAN SECTION;  Surgeon: Molli Posey, MD;  Location: [facility];  Service: Obstetrics;  Laterality: N/A;  . DIAGNOSTIC LAPAROSCOPY N/A 2016   endometreosis  . DIAGNOSTIC LAPAROSCOPY N/A 05/02/2015  . HYSTEROSCOPY     polyp    Family History: Family History  Problem Relation Age of Onset  . Diabetes Mother 89  . Diabetes Maternal Grandmother   . Heart disease Maternal Grandmother   . Hypertension Maternal Grandmother   . Stroke Maternal Grandmother   . Breast cancer Paternal Aunt   . Cancer Paternal Grandfather     Social History: Social History  Substance Use Topics  . Smoking status: Never Smoker  . Smokeless tobacco: Never Used  . Alcohol use No    Allergies:  Allergies  Allergen Reactions  . Sulfa Antibiotics Hives  Meds:  Prescriptions Prior to Admission  Medication Sig Dispense Refill Last Dose  . acetaminophen (TYLENOL) 500 MG tablet Take 500 mg by mouth every 6 (six) hours as needed for headache.   05/15/2017 at Unknown time  . alum & mag hydroxide-simeth (MAALOX/MYLANTA) 200-200-20 MG/5ML suspension Take 30 mLs by mouth every 6 (six) hours as needed for indigestion or heartburn.   05/15/2017 at Unknown time  . docusate sodium (COLACE) 100 MG capsule Take 100 mg by mouth daily as needed for mild constipation or moderate constipation.    Past Week at Unknown  time  . ibuprofen (ADVIL,MOTRIN) 800 MG tablet Take 1 tablet (800 mg total) by mouth every 8 (eight) hours as needed for moderate pain. 30 tablet 0   . oxyCODONE-acetaminophen (PERCOCET/ROXICET) 5-325 MG tablet Take 1-2 tablets by mouth every 4 (four) hours as needed (pain scale 4-7). 15 tablet 0   . Prenatal Vit-Fe Fumarate-FA (PRENATAL MULTIVITAMIN) TABS tablet Take 1 tablet by mouth daily at 12 noon.   05/15/2017 at Unknown time    I have reviewed patient's Past Medical Hx, Surgical Hx, Family Hx, Social Hx, medications and allergies.  ROS:  Review of Systems  Constitutional: Negative for chills and fever.  HENT: Negative for sinus pressure.   Eyes: Negative for blurred vision, photophobia and visual disturbance.  Cardiovascular: Negative for chest pain.  Gastrointestinal: Positive for abdominal pain (only incisional). Negative for constipation, diarrhea, nausea and vomiting.  Genitourinary: Negative for dysuria.  Musculoskeletal: Negative for back pain.  Neurological: Negative for dizziness, speech difficulty, weakness, numbness and headaches.   Other systems negative     Physical Exam  Patient Vitals for the past 24 hrs:  BP Temp Temp src Pulse Resp SpO2 Height Weight  05/24/17 1230 (!) 155/81 - - (!) 102 - - - -  05/24/17 1220 (!) 145/79 98.6 F (37 C) Oral (!) 107 18 - 5\' 7"  (1.702 m) 191 lb 0.6 oz (86.7 kg)  05/24/17 1219 - - - - - 100 % - -   Vitals:   05/24/17 1230 05/24/17 1245 05/24/17 1300 05/24/17 1315  BP: (!) 155/81 (!) 150/88 (!) 143/78 140/81  Pulse: (!) 102 96 90 92  Resp:      Temp:      TempSrc:      SpO2:      Weight:      Height:        Constitutional: Well-developed, well-nourished female in no acute distress.  Cardiovascular: normal rate and rhythm, no ectopy audible, S1 & S2 heard, no murmur Respiratory: normal effort, no distress. Lungs CTAB with no wheezes or crackles GI: Abd soft, appropriately tender over incision, nontender elsewhere.   Nondistended.  No rebound, No guarding.  Bowel Sounds audible    Incision with dressing clean and intact. MS: Extremities nontender, Trace to 1+ edema, normal ROM Neurologic: Alert and oriented x 4.   Grossly nonfocal.   DTRs 2+ with no clonus GU: Neg CVAT. Skin:  Warm and Dry Psych:  Affect appropriate.  PELVIC EXAM: deferred   Labs: --/--/AB NEG (09/21 0554) Results for orders placed or performed during the hospital encounter of 05/24/17 (from the past 24 hour(s))  Protein / creatinine ratio, urine     Status: Abnormal   Collection Time: 05/24/17 12:52 PM  Result Value Ref Range   Creatinine, Urine 34.00 mg/dL   Total Protein, Urine 8 mg/dL   Protein Creatinine Ratio 0.24 (H) 0.00 - 0.15 mg/mg[Cre]  Urinalysis, Routine w reflex  microscopic     Status: Abnormal   Collection Time: 05/24/17 12:52 PM  Result Value Ref Range   Color, Urine STRAW (A) YELLOW   APPearance CLEAR CLEAR   Specific Gravity, Urine 1.005 1.005 - 1.030   pH 6.0 5.0 - 8.0   Glucose, UA NEGATIVE NEGATIVE mg/dL   Hgb urine dipstick SMALL (A) NEGATIVE   Bilirubin Urine NEGATIVE NEGATIVE   Ketones, ur NEGATIVE NEGATIVE mg/dL   Protein, ur NEGATIVE NEGATIVE mg/dL   Nitrite NEGATIVE NEGATIVE   Leukocytes, UA NEGATIVE NEGATIVE   RBC / HPF 0-5 0 - 5 RBC/hpf   WBC, UA 0-5 0 - 5 WBC/hpf   Bacteria, UA NONE SEEN NONE SEEN   Squamous Epithelial / LPF 0-5 (A) NONE SEEN   Mucus PRESENT   CBC     Status: Abnormal   Collection Time: 05/24/17  1:05 PM  Result Value Ref Range   WBC 11.8 (H) 4.0 - 10.5 K/uL   RBC 3.43 (L) 3.87 - 5.11 MIL/uL   Hemoglobin 10.7 (L) 12.0 - 15.0 g/dL   HCT 31.6 (L) 36.0 - 46.0 %   MCV 92.1 78.0 - 100.0 fL   MCH 31.2 26.0 - 34.0 pg   MCHC 33.9 30.0 - 36.0 g/dL   RDW 13.1 11.5 - 15.5 %   Platelets 237 150 - 400 K/uL  Comprehensive metabolic panel     Status: Abnormal   Collection Time: 05/24/17  1:05 PM  Result Value Ref Range   Sodium 140 135 - 145 mmol/L   Potassium 3.8 3.5 - 5.1  mmol/L   Chloride 108 101 - 111 mmol/L   CO2 22 22 - 32 mmol/L   Glucose, Bld 83 65 - 99 mg/dL   BUN 10 6 - 20 mg/dL   Creatinine, Ser 0.61 0.44 - 1.00 mg/dL   Calcium 8.6 (L) 8.9 - 10.3 mg/dL   Total Protein 6.5 6.5 - 8.1 g/dL   Albumin 2.9 (L) 3.5 - 5.0 g/dL   AST 39 15 - 41 U/L   ALT 70 (H) 14 - 54 U/L   Alkaline Phosphatase 85 38 - 126 U/L   Total Bilirubin 0.8 0.3 - 1.2 mg/dL   GFR calc non Af Amer >60 >60 mL/min   GFR calc Af Amer >60 >60 mL/min   Anion gap 10 5 - 15    Imaging:    MAU Course/MDM: I have ordered labs as follows:  CBC, CMET, Protein/creatinine ratio Imaging ordered: none Results reviewed.  Labs are all improved from yesterday. BPs highnormal, none severe  Consult Dr Corinna Capra with presentation, results and exam findings.   Treatments in MAU included observation.   Pt stable at time of discharge.  Assessment: Postpartum x 5 days Preeclampsia improving  Plan: Discharge home Recommend go to office Friday at 1010 for BP check  Encouraged to return here or to other Urgent Care/ED if she develops worsening of symptoms, increase in pain, fever, or other concerning symptoms.   Hansel Feinstein CNM, MSN Certified Nurse-Midwife 05/24/2017 12:43 PM

## 2017-05-24 NOTE — MAU Note (Signed)
Pt sent to MAU from MD office for BP evaluation.  Pt s/p Cesarean Section 05/19/17 for Pre-Eclampsia.  Pt denies H/A & visual disturbances.  Does c/o RUQ pain, describes as crampy.  DTR's 2+, neg clonus.

## 2017-05-24 NOTE — Discharge Instructions (Signed)
Postpartum Hypertension °Postpartum hypertension is high blood pressure after pregnancy that remains higher than normal for more than two days after delivery. You may not realize that you have postpartum hypertension if your blood pressure is not being checked regularly. In some cases, postpartum hypertension will go away on its own, usually within a week of delivery. However, for some women, medical treatment is required to prevent serious complications, such as seizures or stroke. °The following things can affect your blood pressure: °· The type of delivery you had. °· Having received IV fluids or other medicines during or after delivery. ° °What are the causes? °Postpartum hypertension may be caused by any of the following or by a combination of any of the following: °· Hypertension that existed before pregnancy (chronic hypertension). °· Gestational hypertension. °· Preeclampsia or eclampsia. °· Receiving a lot of fluid through an IV during or after delivery. °· Medicines. °· HELLP syndrome. °· Hyperthyroidism. °· Stroke. °· Other rare neurological or blood disorders. ° °In some cases, the cause may not be known. °What increases the risk? °Postpartum hypertension can be related to one or more risk factors, such as: °· Chronic hypertension. In some cases, this may not have been diagnosed before pregnancy. °· Obesity. °· Type 2 diabetes. °· Kidney disease. °· Family history of preeclampsia. °· Other medical conditions that cause hormonal imbalances. ° °What are the signs or symptoms? °As with all types of hypertension, postpartum hypertension may not have any symptoms. Depending on how high your blood pressure is, you may experience: °· Headaches. These may be mild, moderate, or severe. They may also be steady, constant, or sudden in onset (thunderclap headache). °· Visual changes. °· Dizziness. °· Shortness of breath. °· Swelling of your hands, feet, lower legs, or face. In some cases, you may have swelling in  more than one of these locations. °· Heart palpitations or a racing heartbeat. °· Difficulty breathing while lying down. °· Decreased urination. ° °Other rare signs and symptoms may include: °· Sweating more than usual. This lasts longer than a few days after delivery. °· Chest pain. °· Sudden dizziness when you get up from sitting or lying down. °· Seizures. °· Nausea or vomiting. °· Abdominal pain. ° °How is this diagnosed? °The diagnosis of postpartum hypertension is made through a combination of physical examination findings and testing of your blood and urine. You may also have additional tests, such as a CT scan or an MRI, to check for other complications of postpartum hypertension. °How is this treated? °When blood pressure is high enough to require treatment, your options may include: °· Medicines to reduce blood pressure (antihypertensives). Tell your health care provider if you are breastfeeding or if you plan to breastfeed. There are many antihypertensive medicines that are safe to take while breastfeeding. °· Stopping medicines that may be causing hypertension. °· Treating medical conditions that are causing hypertension. °· Treating the complications of hypertension, such as seizures, stroke, or kidney problems. ° °Your health care provider will also continue to monitor your blood pressure closely and repeatedly until it is within a safe range for you. °Follow these instructions at home: °· Take medicines only as directed by your health care provider. °· Get regular exercise after your health care provider tells you that it is safe. °· Follow your health care provider’s recommendations on fluid and salt restrictions. °· Do not use any tobacco products, including cigarettes, chewing tobacco, or electronic cigarettes. If you need help quitting, ask your health care provider. °·   Keep all follow-up visits as directed by your health care provider. This is important. °Contact a health care provider  if: °· Your symptoms get worse. °· You have new symptoms, such as: °? Headache. °? Dizziness. °? Visual changes. °Get help right away if: °· You develop a severe or sudden headache. °· You have seizures. °· You develop numbness or weakness on one side of your body. °· You have difficulty thinking, speaking, or swallowing. °· You develop severe abdominal pain. °· You develop difficulty breathing, chest pain, a racing heartbeat, or heart palpitations. °These symptoms may represent a serious problem that is an emergency. Do not wait to see if the symptoms will go away. Get medical help right away. Call your local emergency services (911 in the [place] Do not drive yourself to the hospital. °This information is not intended to replace advice given to you by your health care provider. Make sure you discuss any questions you have with your health care provider. °Document Released: 04/20/2014 Document Revised: 01/20/2016 Document Reviewed: 03/01/2014 °Elsevier Interactive Patient Education © 2018 [employer] ° °

## 2017-05-25 NOTE — Progress Notes (Signed)
Post discharge chart review completed.  

## 2017-06-12 ENCOUNTER — Ambulatory Visit: Payer: Self-pay

## 2017-06-12 NOTE — Lactation Note (Signed)
This note was copied from a baby's chart. Lactation Consultation Note  Patient Name: Diamond Hardin EHOZY'Y Date: 06/12/2017 Reason for consult: Follow-up assessment;Mother's request;NICU baby;Preterm <34wks;Infant < 6lbs   Follow up with mom of 62 week old NICU infant. Mom stopped me while in NICU to ask about milk supply. Mom reports she is pumping at least every 3 hours and is setting an alarm to wake up at night to pump. Mom pumps 1-3 oz/ pumping 7-8 x a day with a Symphony pump. Mom reports infant is still needing donor milk in addition to what mom is pumping.   Mom is eating steel cut oats daily and had recently started taking Postnatal Lactation Plus capsules, 2 capsules daily. (2 capsules contain Fenugreek 610 mg, Milk Thistle Seed Extract 420 mg, Fennel seed powder 120 mg, Shatavari root powder 100 mg, Marshmallow Root extract 100 mg). Mom reports her milk supply did increase some with this added to her diet. Gave mom handout on Fenugreek and informed her that the dosage that tends to work best for increasing milk production is 610 mg 2-3 capsules 3 times a day. Mom plans to increase Fenugreek dosage only. Mom was given Lactation Cookie recipe, she reports she has been adding Brewers yeast to her oatmeal.   Enc mom to power pump once daily either in the morning or at night and to get 5-6 hours of uninterrupted sleep at night unless her breasts wake her up.   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 She asked about using the [facility] pump that is wearable, told her I was not familiar with it, Enc her to try it while at work if she wants and to make sure she pumps with the Symphony pump when not at work. Enc mom to make sure she pumps long enough to empty breasts with each pumping.   Mom without further questions/concerns at this time.    Maternal Data Formula Feeding for Exclusion: No Has patient been taught Hand  Expression?: Yes Does the patient have breastfeeding experience prior to this delivery?: No  Feeding Feeding Type: Breast Milk Length of feed: 60 min  LATCH Score                   Interventions    Lactation Tools Discussed/Used     Consult Status Consult Status: PRN Follow-up type: Call as needed    Donn Pierini 06/12/2017, 11:58 AM

## 2017-06-16 ENCOUNTER — Ambulatory Visit: Payer: Self-pay

## 2017-06-16 NOTE — Lactation Note (Addendum)
This note was copied from a baby's chart. Lactation Consultation Note  Patient Name: Girl Daesha Insco IDHWY'S Date: 06/16/2017 Reason for consult: Follow-up assessment;Other (Comment) (Low milk supply.)  NICU baby 75 weeks old. Mom reports that she slept for 6 hours last night, then power-pumped and collected 3 ounces of EBM. Mom states that she wasn't collecting anything the 3 times she pumped before hours of sleep. Mom states the most she usually collects is 1-1.5 ounces every 3 hours. So, enc mom to pump every 2-3 hours for a total of at least 8 times/24 hours followed by hand expression. Discussed with mom the benefits of EBM, regardless of whether baby has to also have formula. Mom seemed relieved that she is benefiting the baby regardless of the milk volume. Discussed ways of protecting mom's milk supply and enc her to continue with the methods she is currently using. Enc mom to discuss galactagogues with OB as well.   Maternal Data    Feeding Feeding Type: Donor Breast Milk Length of feed: 60 min  LATCH Score                   Interventions    Lactation Tools Discussed/Used     Consult Status Consult Status: PRN    Andres Labrum 06/16/2017, 11:15 AM
